# Patient Record
Sex: Male | Born: 1955 | Race: Black or African American | Hispanic: No | Marital: Single
Health system: Southern US, Academic
[De-identification: ages and names within clinical notes are randomized; demographics above are authoritative.]

## PROBLEM LIST (undated history)

## (undated) ENCOUNTER — Ambulatory Visit

## (undated) ENCOUNTER — Telehealth

## (undated) ENCOUNTER — Encounter

## (undated) ENCOUNTER — Encounter: Attending: Ambulatory Care | Primary: Ambulatory Care

## (undated) ENCOUNTER — Ambulatory Visit: Payer: MEDICARE

## (undated) ENCOUNTER — Ambulatory Visit: Attending: Ambulatory Care | Primary: Ambulatory Care

## (undated) ENCOUNTER — Telehealth: Attending: Ambulatory Care | Primary: Ambulatory Care

## (undated) DIAGNOSIS — F4310 Post-traumatic stress disorder, unspecified: Secondary | ICD-10-CM

## (undated) DIAGNOSIS — I10 Essential (primary) hypertension: Secondary | ICD-10-CM

## (undated) DIAGNOSIS — F909 Attention-deficit hyperactivity disorder, unspecified type: Secondary | ICD-10-CM

## (undated) DIAGNOSIS — F209 Schizophrenia, unspecified: Secondary | ICD-10-CM

## (undated) DIAGNOSIS — F32A Depression, unspecified: Secondary | ICD-10-CM

## (undated) DIAGNOSIS — F329 Major depressive disorder, single episode, unspecified: Secondary | ICD-10-CM

## (undated) DIAGNOSIS — F419 Anxiety disorder, unspecified: Secondary | ICD-10-CM

## (undated) HISTORY — PX: NASAL DERMOID CYST EXCISION: SHX2063

---
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
## 2015-02-15 ENCOUNTER — Emergency Department
Admission: EM | Admit: 2015-02-15 | Discharge: 2015-02-15 | Discharge status: Absent without leave | Payer: Medicare Other

## 2015-02-15 NOTE — ED Notes (Signed)
Patient should be dismissed from board. Dismiss icon not available.

## 2015-03-26 DIAGNOSIS — E559 Vitamin D deficiency, unspecified: Secondary | ICD-10-CM | POA: Insufficient documentation

## 2015-03-26 DIAGNOSIS — I10 Essential (primary) hypertension: Secondary | ICD-10-CM | POA: Insufficient documentation

## 2015-04-06 DIAGNOSIS — Z227 Latent tuberculosis: Secondary | ICD-10-CM | POA: Insufficient documentation

## 2015-04-06 DIAGNOSIS — Z79899 Other long term (current) drug therapy: Secondary | ICD-10-CM | POA: Insufficient documentation

## 2015-04-06 DIAGNOSIS — M059 Rheumatoid arthritis with rheumatoid factor, unspecified: Secondary | ICD-10-CM | POA: Insufficient documentation

## 2015-04-06 DIAGNOSIS — I7300 Raynaud's syndrome without gangrene: Secondary | ICD-10-CM | POA: Insufficient documentation

## 2015-04-29 ENCOUNTER — Encounter: Payer: Self-pay | Admitting: *Deleted

## 2015-04-30 ENCOUNTER — Encounter: Admission: RE | Payer: Self-pay | Source: Ambulatory Visit

## 2015-04-30 ENCOUNTER — Ambulatory Visit: Admission: RE | Admit: 2015-04-30 | Payer: Medicare Other | Source: Ambulatory Visit | Admitting: Gastroenterology

## 2015-04-30 HISTORY — DX: Essential (primary) hypertension: I10

## 2015-04-30 HISTORY — DX: Post-traumatic stress disorder, unspecified: F43.10

## 2015-04-30 HISTORY — DX: Depression, unspecified: F32.A

## 2015-04-30 HISTORY — DX: Schizophrenia, unspecified: F20.9

## 2015-04-30 HISTORY — DX: Anxiety disorder, unspecified: F41.9

## 2015-04-30 HISTORY — DX: Attention-deficit hyperactivity disorder, unspecified type: F90.9

## 2015-04-30 HISTORY — DX: Major depressive disorder, single episode, unspecified: F32.9

## 2015-04-30 SURGERY — COLONOSCOPY WITH PROPOFOL
Anesthesia: General

## 2015-06-28 DIAGNOSIS — F411 Generalized anxiety disorder: Secondary | ICD-10-CM | POA: Insufficient documentation

## 2016-05-07 ENCOUNTER — Encounter: Payer: Self-pay | Admitting: Emergency Medicine

## 2016-05-07 ENCOUNTER — Emergency Department: Payer: Medicare Other

## 2016-05-07 ENCOUNTER — Emergency Department
Admission: EM | Admit: 2016-05-07 | Discharge: 2016-05-07 | Disposition: A | Discharge status: Home / Self Care | Payer: Medicare Other | Attending: Emergency Medicine | Admitting: Emergency Medicine

## 2016-05-07 DIAGNOSIS — I10 Essential (primary) hypertension: Secondary | ICD-10-CM | POA: Diagnosis not present

## 2016-05-07 DIAGNOSIS — R040 Epistaxis: Secondary | ICD-10-CM | POA: Diagnosis not present

## 2016-05-07 DIAGNOSIS — Z5321 Procedure and treatment not carried out due to patient leaving prior to being seen by health care provider: Secondary | ICD-10-CM | POA: Diagnosis not present

## 2016-05-07 DIAGNOSIS — Z79899 Other long term (current) drug therapy: Secondary | ICD-10-CM | POA: Insufficient documentation

## 2016-05-07 LAB — CBC
HEMATOCRIT: 39.8 % — AB (ref 40.0–52.0)
HEMOGLOBIN: 13.5 g/dL (ref 13.0–18.0)
MCH: 30.6 pg (ref 26.0–34.0)
MCHC: 34 g/dL (ref 32.0–36.0)
MCV: 89.9 fL (ref 80.0–100.0)
Platelets: 224 10*3/uL (ref 150–440)
RBC: 4.42 MIL/uL (ref 4.40–5.90)
RDW: 14.3 % (ref 11.5–14.5)
WBC: 5.7 10*3/uL (ref 3.8–10.6)

## 2016-05-07 LAB — BASIC METABOLIC PANEL
ANION GAP: 3 — AB (ref 5–15)
BUN: 22 mg/dL — ABNORMAL HIGH (ref 6–20)
CALCIUM: 9.3 mg/dL (ref 8.9–10.3)
CO2: 29 mmol/L (ref 22–32)
CREATININE: 1.21 mg/dL (ref 0.61–1.24)
Chloride: 108 mmol/L (ref 101–111)
GFR calc non Af Amer: >60 mL/min (ref >60)
Glucose, Bld: 94 mg/dL (ref 65–99)
Potassium: 4.5 mmol/L (ref 3.5–5.1)
SODIUM: 140 mmol/L (ref 135–145)

## 2016-05-07 LAB — TROPONIN I: Troponin I: <0.03 ng/mL (ref <0.03)

## 2016-05-07 NOTE — ED Triage Notes (Addendum)
Pt presents to ED with c/o epistaxis x 3 weeks, intermittently. Pt presents now with bleeding to his L nostril. Pt also c/o chest pain that started this morning, R sided, sharp in nature. Pt denies radiation. Pt states he thinks the chest pain, is related to his anxiety, states he normally takes medication for anxiety but he does not have any. Pt has tissues applied to L nostril in triage. Pt states he is getting infusions of [employer] for arthritis q 2 months.

## 2016-05-07 NOTE — ED Notes (Signed)
Pt came to desk and stated he was going to leave. Pt stated he was not going to wait any longer. No apparent distress noted. No bleeding from nose noted.

## 2016-05-08 ENCOUNTER — Telehealth: Payer: Self-pay | Admitting: Emergency Medicine

## 2016-05-08 NOTE — Telephone Encounter (Signed)
Called patient due to lwot to inquire about condition and follow up plans. The number listed has voicemail message with somone elses name on it.  Did not leave message

## 2016-09-01 DIAGNOSIS — F209 Schizophrenia, unspecified: Secondary | ICD-10-CM | POA: Insufficient documentation

## 2016-09-01 DIAGNOSIS — D649 Anemia, unspecified: Secondary | ICD-10-CM | POA: Insufficient documentation

## 2016-09-01 DIAGNOSIS — Z8619 Personal history of other infectious and parasitic diseases: Secondary | ICD-10-CM | POA: Insufficient documentation

## 2016-09-01 DIAGNOSIS — R21 Rash and other nonspecific skin eruption: Secondary | ICD-10-CM | POA: Insufficient documentation

## 2016-09-06 ENCOUNTER — Ambulatory Visit
Admission: RE | Admit: 2016-09-06 | Discharge: 2016-09-06 | Disposition: A | Discharge status: Home / Self Care | Payer: MEDICARE

## 2016-09-06 DIAGNOSIS — M0579 Rheumatoid arthritis with rheumatoid factor of multiple sites without organ or systems involvement: Principal | ICD-10-CM

## 2016-11-01 ENCOUNTER — Ambulatory Visit
Admission: RE | Admit: 2016-11-01 | Discharge: 2016-11-01 | Disposition: A | Discharge status: Home / Self Care | Payer: MEDICARE

## 2016-11-01 DIAGNOSIS — M0579 Rheumatoid arthritis with rheumatoid factor of multiple sites without organ or systems involvement: Principal | ICD-10-CM

## 2016-12-27 ENCOUNTER — Ambulatory Visit
Admission: RE | Admit: 2016-12-27 | Discharge: 2016-12-27 | Disposition: A | Discharge status: Home / Self Care | Payer: MEDICARE

## 2016-12-27 DIAGNOSIS — M0579 Rheumatoid arthritis with rheumatoid factor of multiple sites without organ or systems involvement: Principal | ICD-10-CM

## 2017-01-19 ENCOUNTER — Ambulatory Visit
Admission: RE | Admit: 2017-01-19 | Discharge: 2017-01-19 | Disposition: A | Discharge status: Home / Self Care | Payer: MEDICARE | Attending: Rheumatology

## 2017-01-19 DIAGNOSIS — Z79899 Other long term (current) drug therapy: Secondary | ICD-10-CM

## 2017-01-19 DIAGNOSIS — M0579 Rheumatoid arthritis with rheumatoid factor of multiple sites without organ or systems involvement: Principal | ICD-10-CM

## 2017-01-19 DIAGNOSIS — Z7189 Other specified counseling: Secondary | ICD-10-CM

## 2017-01-19 MED ORDER — METHOTREXATE SODIUM 25 MG/ML INJECTION SOLUTION
SUBCUTANEOUS | 1 refills | 0 days | Status: CP
Start: 2017-01-19 — End: 2017-04-16

## 2017-01-19 MED ORDER — FOLIC ACID 1 MG TABLET
ORAL_TABLET | Freq: Every day | ORAL | 3 refills | 0.00000 days | Status: CP
Start: 2017-01-19 — End: 2017-04-16

## 2017-01-29 NOTE — Unmapped (Signed)
Reached out to patient at the request of Dr. Sullivan Lone to discuss cost of medications.  Subq methotrexate script was sent to [facility] but patient will need to apply to [facility] PAP program.  Patient informed Dr. Sullivan Lone cannot afford folic acid also.  Left VM with patient again requesting call back.     [name]

## 2017-02-01 NOTE — Unmapped (Addendum)
Patient returned phone call and states he cannot afford any of his medications.  He is interested in applying for the [facility] PAP program.  He has Medicare Part A and B and thus would only be approved for PAP until the end of the year and cannot be re-applied.  Application completed with patient over the phone and advised him to bring documentation of income and and ID to clinic on 02/22/16 during his Remicade infusion appointment.  We will not submit PAP application until January in order to qualify patient for 1 year of PAP.     Patient expressed understanding.    [name]

## 2017-03-06 NOTE — Unmapped (Signed)
Patient no show to Remicade infusion, therefore unable to obtain patient's signature and documentation of income for PAP application.  Incomplete application available in my office once patient decides to proceed.     Roy Kirk

## 2017-03-07 NOTE — Unmapped (Signed)
Per Dr. Elisabeth Cara 1/16 msgCan you call this pt and ask him if he is interested in continuing to follow at [facility] to schedule a f/u appt with me?????April or so would be great.

## 2017-03-13 ENCOUNTER — Emergency Department
Admission: EM | Admit: 2017-03-13 | Discharge: 2017-03-13 | Disposition: A | Discharge status: Home / Self Care | Payer: Medicare Other | Attending: Emergency Medicine | Admitting: Emergency Medicine

## 2017-03-13 ENCOUNTER — Encounter: Payer: Self-pay | Admitting: Emergency Medicine

## 2017-03-13 ENCOUNTER — Emergency Department: Payer: Medicare Other

## 2017-03-13 DIAGNOSIS — L249 Irritant contact dermatitis, unspecified cause: Secondary | ICD-10-CM | POA: Diagnosis not present

## 2017-03-13 DIAGNOSIS — Z79899 Other long term (current) drug therapy: Secondary | ICD-10-CM | POA: Diagnosis not present

## 2017-03-13 DIAGNOSIS — I10 Essential (primary) hypertension: Secondary | ICD-10-CM | POA: Diagnosis not present

## 2017-03-13 DIAGNOSIS — R05 Cough: Secondary | ICD-10-CM | POA: Insufficient documentation

## 2017-03-13 DIAGNOSIS — R0789 Other chest pain: Secondary | ICD-10-CM

## 2017-03-13 LAB — BASIC METABOLIC PANEL
Anion gap: 9 (ref 5–15)
BUN: 20 mg/dL (ref 6–20)
CO2: 24 mmol/L (ref 22–32)
Calcium: 9.4 mg/dL (ref 8.9–10.3)
Chloride: 105 mmol/L (ref 101–111)
Creatinine, Ser: 1.23 mg/dL (ref 0.61–1.24)
GFR calc Af Amer: >60 mL/min (ref >60)
GLUCOSE: 105 mg/dL — AB (ref 65–99)
Potassium: 4 mmol/L (ref 3.5–5.1)
SODIUM: 138 mmol/L (ref 135–145)

## 2017-03-13 LAB — TROPONIN I

## 2017-03-13 LAB — CBC
HEMATOCRIT: 39.7 % — AB (ref 40.0–52.0)
Hemoglobin: 13.3 g/dL (ref 13.0–18.0)
MCH: 30.7 pg (ref 26.0–34.0)
MCHC: 33.5 g/dL (ref 32.0–36.0)
MCV: 91.6 fL (ref 80.0–100.0)
PLATELETS: 247 10*3/uL (ref 150–440)
RBC: 4.34 MIL/uL — ABNORMAL LOW (ref 4.40–5.90)
RDW: 14.4 % (ref 11.5–14.5)
WBC: 7.1 10*3/uL (ref 3.8–10.6)

## 2017-03-13 MED ORDER — NAPROXEN 375 MG PO TABS: 375.0000 mg | ORAL_TABLET | Freq: Two times a day (BID) | ORAL | 0 refills | Status: AC

## 2017-03-13 MED ORDER — TRIAMCINOLONE ACETONIDE 0.5 % EX OINT: 1.0000 "application " | TOPICAL_OINTMENT | Freq: Two times a day (BID) | CUTANEOUS | 0 refills | Status: DC

## 2017-03-13 NOTE — ED Provider Notes (Signed)
[facility] Emergency Department Provider Note  ____________________________________________   I have reviewed the triage vital signs and the nursing notes. Where available I have reviewed prior notes and, if possible and indicated, outside hospital notes.    HISTORY  Chief Complaint No chief complaint on file.    HPI Austin Preston is a 62 y.o. male this with 2 things, the first is that he is "getting over a cold" has had some chest wall pain when he coughs on the left side very reproducible worse when he touches it nothing makes it worse aside from coughing or change in position or touching it, there is no exertional component, does not radiate, so sharp tenderness to the chest wall, he is certain it is nothing else but he wanted to have it looked at, he denies any rash in that area, denies any fever chills.  States his breathing is normal and that he is over his cold.  He has not tried anything for this pain it is sharp.  Patient complains of a itchy rash around his forehead and the back of his scalp where he used a hair dye which has caused him to be itchy.  He is taking Benadryl with minimal success.  No anaphylactic symptoms no shortness of breath no facial swelling no tongue swelling etc.     Past Medical History:  Diagnosis Date  . ADHD (attention deficit hyperactivity disorder)   . Anxiety   . Depression   . Hypertension   . PTSD (post-traumatic stress disorder)   . Schizophrenia (HCC)     There are no active problems to display for this patient.   Past Surgical History:  Procedure Laterality Date  . NASAL DERMOID CYST EXCISION      Prior to Admission medications   Medication Sig Start Date End Date Taking? Authorizing Provider  amitriptyline (ELAVIL) 10 MG tablet Take 10 mg by mouth at bedtime.    [provider]  diazepam (VALIUM) 10 MG tablet Take 10 mg by mouth 2 (two) times daily as needed for anxiety.    [provider]   ergocalciferol (VITAMIN D2) 50000 units capsule Take 50,000 Units by mouth once a week.    [provider]  folic acid (FOLVITE) 1 MG tablet Take 1 mg by mouth daily.    [provider]  inFLIXimab (REMICADE) 100 MG injection Inject 100 mg into the vein.    [provider]  lisinopril-hydrochlorothiazide (PRINZIDE,ZESTORETIC) 20-25 MG tablet Take 1 tablet by mouth daily.    [provider]  methotrexate (RHEUMATREX) 2.5 MG tablet Take 20 mg by mouth once a week.    [provider]    Allergies Patient has no known allergies.  No family history on file.  Social History Social History   Tobacco Use  . Smoking status: Never Smoker  . Smokeless tobacco: Never Used  Substance Use Topics  . Alcohol use: Yes  . Drug use: No    Comment: NO currently drug use, hx of use of "everything".    Review of Systems Constitutional: No fever/chills Eyes: No visual changes. ENT: No sore throat. No stiff neck no neck pain Cardiovascular: + chest pain. Respiratory: Denies shortness of breath. Gastrointestinal:   no vomiting.  No diarrhea.  No constipation. Genitourinary: Negative for dysuria. Musculoskeletal: Negative lower extremity swelling Skin: + for rash. Neurological: Negative for severe headaches, focal weakness or numbness.   ____________________________________________   PHYSICAL EXAM:  VITAL SIGNS: ED Triage Vitals  Enc Vitals Group     BP 03/13/17 1608 (!) 166/96     Pulse Rate 03/13/17 1608 75     Resp 03/13/17 1608 20     Temp 03/13/17 1608 99 F (37.2 C)     Temp Source 03/13/17 1608 Oral     SpO2 03/13/17 1608 98 %     Weight 03/13/17 1610 175 lb (79.4 kg)     Height 03/13/17 1610 5\' 9"  (1.753 m)     Head Circumference --      Peak Flow --      Pain Score 03/13/17 1615 9     Pain Loc --      Pain Edu? --      Excl. in GC? --     Constitutional: Alert and oriented. Well appearing and in no acute distress. Eyes:  Conjunctivae are normal Head: Atraumatic HEENT: No congestion/rhinnorhea. Mucous membranes are moist.  Oropharynx non-erythematous Neck:   Nontender with no meningismus, no masses, no stridor Cardiovascular: Normal rate, regular rhythm. Grossly normal heart sounds.  Good peripheral circulation. Respiratory: Normal respiratory effort.  No retractions. Lungs CTAB. Abdominal: Soft and nontender. No distention. No guarding no rebound Chest: Tender to palpation left chest wall necessary patient states "of chest pain with or or" and feels back.  No crepitus no flail chest, no lesions noted.  No rib fracture detected on exam Back:  There is no focal tenderness or step off.  there is no midline tenderness there are no lesions noted. there is no CVA tenderness  Musculoskeletal: No lower extremity tenderness, no upper extremity tenderness. No joint effusions, no DVT signs strong distal pulses no edema Neurologic:  Normal speech and language. No gross focal neurologic deficits are appreciated.  Skin:  Skin is warm, dry and intact.  Erythematous rash around the patient's hairline with some mild excoriation no evidence of cellulitis or abscess, involves both the front and the back of the her . Psychiatric: Mood and affect are normal. Speech and behavior are normal.  ____________________________________________   LABS (all labs ordered are listed, but only abnormal results are displayed)  Labs Reviewed  BASIC METABOLIC PANEL - Abnormal; Notable for the following components:      Result Value   Glucose, Bld 105 (*)    All other components within normal limits  CBC - Abnormal; Notable for the following components:   RBC 4.34 (*)    HCT 39.7 (*)    All other components within normal limits  TROPONIN I    Pertinent labs  results that were available during my care of the patient were reviewed by me and considered in my medical decision making (see chart for  details). ____________________________________________  EKG  I personally interpreted any EKGs ordered by me or triage Sinus rhythm rate 72 bpm no acute ST elevation or depression, borderline LAD, no acute ischemic changes ____________________________________________  RADIOLOGY  Pertinent labs & imaging results that were available during my care of the patient were reviewed by me and considered in my medical decision making (see chart for details). If possible, patient and/or family made aware of any abnormal findings.  Dg Chest 2 View  Result Date: 03/13/2017 CLINICAL DATA:  Chest pain for the past 3 days. EXAM: CHEST  2 VIEW COMPARISON:  Chest x-ray dated May 07, 2016. FINDINGS: The heart size and mediastinal contours are within normal limits. Unchanged calcified hilar lymph nodes and granulomas in both lungs. No focal consolidation, pleural effusion, or pneumothorax. The visualized  skeletal structures are unremarkable. IMPRESSION: No active cardiopulmonary disease. Electronically Signed   By: Obie DredgeWilliam T Derry M.D.   On: 03/13/2017 16:38   ____________________________________________    PROCEDURES  Procedure(s) performed: None  Procedures  Critical Care performed: None  ____________________________________________   INITIAL IMPRESSION / ASSESSMENT AND PLAN / ED COURSE  Pertinent labs & imaging results that were available during my care of the patient were reviewed by me and considered in my medical decision making (see chart for details).  With very reproducible chest wall pain after a cough, at this time, there does not appear to be clinical evidence to support the diagnosis of pulmonary embolus, dissection, myocarditis, endocarditis, pericarditis, pericardial tamponade, acute coronary syndrome, pneumothorax, pneumonia, or any other acute intrathoracic pathology that will require admission or acute intervention. Nor is there evidence of any significant intra-abdominal pathology  causing this discomfort.  In addition he has what appears to be contact dermatitis which we will treat with topical steroids..         ____________________________________________   FINAL CLINICAL IMPRESSION(S) / ED DIAGNOSES  Final diagnoses:  None      This chart was dictated using voice recognition software.  Despite best efforts to proofread,  errors can occur which can change meaning.      Jeanmarie PlantMcShane, Hondo Nanda A, MD 03/13/17 [phone]

## 2017-03-13 NOTE — ED Triage Notes (Signed)
Pt in via POV with complaints of left side chest pain x three days.  Pt also with complaints of rash to face since using hair dye x one week ago, complaints of severe itching, taking benadryl without any relief.  Pt unsure if the two are related.  Vitals WDL, NAD noted at this time.

## 2017-03-13 NOTE — ED Notes (Signed)
Pt discharged to home.  Family member driving.  Discharge instructions reviewed.  Verbalized understanding.  No questions or concerns at this time.  Teach back verified.  Pt in NAD.  No items left in ED.   

## 2017-03-27 DIAGNOSIS — L403 Pustulosis palmaris et plantaris: Secondary | ICD-10-CM | POA: Insufficient documentation

## 2017-04-09 ENCOUNTER — Encounter: Admit: 2017-04-09 | Discharge: 2017-04-10 | Payer: MEDICARE

## 2017-04-09 DIAGNOSIS — M0579 Rheumatoid arthritis with rheumatoid factor of multiple sites without organ or systems involvement: Principal | ICD-10-CM

## 2017-04-09 LAB — CREATININE
Creatinine:MCnc:Pt:Ser/Plas:Qn:: 1.1
EGFR MDRD NON AF AMER: 69 mL/min/{1.73_m2} (ref >=60)

## 2017-04-09 LAB — CBC W/ AUTO DIFF
BASOPHILS ABSOLUTE COUNT: 0 10*9/L (ref 0.0–0.1)
BASOPHILS RELATIVE PERCENT: 0.6 %
EOSINOPHILS ABSOLUTE COUNT: 0.2 10*9/L (ref 0.0–0.4)
EOSINOPHILS RELATIVE PERCENT: 3.1 %
HEMATOCRIT: 42 % (ref 41.0–53.0)
HEMOGLOBIN: 13.7 g/dL (ref 13.5–17.5)
LARGE UNSTAINED CELLS: 3 % (ref 0–4)
LYMPHOCYTES ABSOLUTE COUNT: 1.5 10*9/L (ref 1.5–5.0)
LYMPHOCYTES RELATIVE PERCENT: 25.3 %
MEAN CORPUSCULAR HEMOGLOBIN CONC: 32.6 g/dL (ref 31.0–37.0)
MEAN CORPUSCULAR HEMOGLOBIN: 29.7 pg (ref 26.0–34.0)
MEAN CORPUSCULAR VOLUME: 90.9 fL (ref 80.0–100.0)
MEAN PLATELET VOLUME: 7.6 fL (ref 7.0–10.0)
MONOCYTES RELATIVE PERCENT: 11.1 %
NEUTROPHILS ABSOLUTE COUNT: 3.4 10*9/L (ref 2.0–7.5)
NEUTROPHILS RELATIVE PERCENT: 57.1 %
RED BLOOD CELL COUNT: 4.62 10*12/L (ref 4.50–5.90)
WBC ADJUSTED: 5.9 10*9/L (ref 4.5–11.0)

## 2017-04-09 LAB — ALT (SGPT): Alanine aminotransferase:CCnc:Pt:Ser/Plas:Qn:: 26

## 2017-04-09 LAB — BLOOD UREA NITROGEN: Urea nitrogen:MCnc:Pt:Ser/Plas:Qn:: 13

## 2017-04-09 LAB — AST (SGOT): Aspartate aminotransferase:CCnc:Pt:Ser/Plas:Qn:: 32

## 2017-04-09 LAB — EOSINOPHILS RELATIVE PERCENT: Lab: 3.1

## 2017-04-09 NOTE — Unmapped (Signed)
Patient in today for Remicade infusion. Patient has no s/s of infection. No issues from the last infusion.  Remicade started ,patient instructed to use call bell /call nurse for any s/s of unusual symptoms during infusion. Patient educated on possible s/s of reaction,such as chest pain ,itching,shortness of breath lightheadedness and any kind of discomfort. Patient verbalized understanding.  PIV #24G initiated to right AC.  Labs drawn.  Pt refused all pre-meds.  Remicade 500mg /250 NS started at 1111 and infused over 2hr protocol. Infusion completed and well tolerated.  Pt alert and oriented with no complaints during infusion. Pt tolerated infusion well.  Remicade 2hr protocol  Start of infusion 10ml x                             20ml x                            40ml x                            80ml x                          x                          x  PIV discontinued. Coban and gauze applied.

## 2017-04-09 NOTE — Unmapped (Signed)
Patient is receiving Remicade infusion today.  He brought in documentation if income and provided signatures for [facility] PAP application.  Application faxed to program and advised patient to call them directly for status update.      Roy Kirk

## 2017-04-09 NOTE — Unmapped (Signed)
Requested patient stay for a time after last b/p- to monitor- patient refused

## 2017-04-16 MED ORDER — METHOTREXATE SODIUM 25 MG/ML INJECTION SOLUTION
SUBCUTANEOUS | 1 refills | 0.00000 days | Status: CP
Start: 2017-04-16 — End: 2017-12-24

## 2017-04-16 MED ORDER — SYRINGE WITH NEEDLE 1 ML 27 X 1/2"
INJECTION | SUBCUTANEOUS | 8 refills | 0.00000 days | Status: CP
Start: 2017-04-16 — End: 2017-04-16

## 2017-04-16 MED ORDER — SYRINGE WITH NEEDLE 1 ML 27 X 1/2": each | 8 refills | 0 days | Status: AC

## 2017-04-16 MED ORDER — FOLIC ACID 1 MG TABLET: 1 mg | tablet | Freq: Every day | 1 refills | 0 days | Status: AC

## 2017-04-16 MED ORDER — METHOTREXATE SODIUM 25 MG/ML INJECTION SOLUTION: mL | 1 refills | 0 days

## 2017-04-16 MED ORDER — FOLIC ACID 1 MG TABLET
ORAL_TABLET | Freq: Every day | ORAL | 1 refills | 0.00000 days | Status: CP
Start: 2017-04-16 — End: 2017-12-24

## 2017-04-16 MED FILL — METHOTREXATE/25MG/ML/SOLN: METHOTREXATE/25MG/ML/SOLN | 84 days supply | Qty: 6 | Fill #0

## 2017-04-16 MED FILL — FOLIC ACID/1MG/TABS: FOLIC ACID/1MG/TABS | 90 days supply | Qty: 90 | Fill #0

## 2017-04-16 NOTE — Unmapped (Signed)
[facility] - Pharmacist Onboarding Note    Specialty Medication: subq methotrexate     Roy Kirk, DOB: October 29, 1955  Above HIPAA information was verified with patient.     Roy Kirk is a 62 y.o. male being initiated on subq methotrexate for rheumatoid arthritis. Medication list, allergies and comorbidities reviewed:  appropriate to continue therapy.       Regimen & Administration: Methotrexate 25 mg (1 mL) subq once every 7 days.    Administer without regards to meal.  If a dose is missed, administer as soon as it is remembered and restart administration cycle    Patient has been on subq methotrexate before and reports adherence to therapy.  He has a nursing aid that helps with subq methotrexate weekly.  He denies further injection training and medication counseling at this time.      Drug-Drug & Drug-Food Interactions:  None noted    Pharmacy Information:    ?? Patient will be receiving medication from the [facility] [phone], option 4).  A representative from the pharmacy will contact the patient to set up deliveries 7-10 days prior to their subsequent needed refill.  The pharmacy must speak to the patient to schedule the refill.  Advised patient to answer phone calls from the pharmacy to prevent delays in therapy.    ?? Patient will receive a medication information handout as well as a welcome packet from the pharmacy.    ?? The pharmacy is open Monday - Friday 8:30am-4:30pm.  A pharmacist is available 24/7 via pager to answer any clinical questions.    ?? Patient will receive medication through common mail carrier (UPS).  ?? Anticipated co-pay:  $0 for a 3 month supply through [facility] PAP program.      Emphasized the importance of adherence to prescribed regimen, clinic follow-up visits, and laboratory testing.      SHIPPING     ?? Shipping address verified in FSI.  Expected medication delivery date: 04/18/17.  Patient plans to start therapy on the same day.    ?? Other medications/items to be shipping:  Folic acid, syringe with needles, sharps container    Patient specific needs were assessed and addressed:  language differences, literacy level, cultural barriers, cognitive and/or physical impairments.      All questions were answered and contact information provided for any future questions/concerns.      Jeneen Montgomery

## 2017-04-16 NOTE — Unmapped (Signed)
[facility] Specialty Medication Referral: No PA required    Medication (Brand/Generic): METHOTREXATE    Initial FSI Test Claim completed with resulted information below:  No PA required  Patient ABLE to fill at [facility]  Insurance Company:  PAP  Anticipated Copay: $0    As Co-pay is under $100 defined limit, per policy there will be no further investigation of need for financial assistance at this time unless patient requests. This referral has been communicated to the provider and handed off to the [facility] team for further processing and filling of prescribed medication.   ______________________________________________________________________  Please utilize this referral for viewing purposes as it will serve as the central location for all relevant documentation and updates.

## 2017-04-17 MED ORDER — EMPTY CONTAINER
3 refills | 0 days
Start: 2017-04-17 — End: 2018-02-21

## 2017-04-17 MED FILL — SHARPS KIT/NA/MISC: SHARPS KIT/NA/MISC | 100 days supply | Qty: 1 | Fill #0

## 2017-04-17 MED FILL — BD TB 1mL/27G 1/2 INCH/SYR: BD TB 1mL/27G 1/2 INCH/SYR | 84 days supply | Qty: 12 | Fill #0

## 2017-06-06 ENCOUNTER — Encounter: Admit: 2017-06-06 | Discharge: 2017-06-07 | Payer: MEDICARE

## 2017-06-06 DIAGNOSIS — M0579 Rheumatoid arthritis with rheumatoid factor of multiple sites without organ or systems involvement: Principal | ICD-10-CM

## 2017-06-06 LAB — CBC W/ AUTO DIFF
BASOPHILS ABSOLUTE COUNT: 0 10*9/L (ref 0.0–0.1)
BASOPHILS RELATIVE PERCENT: 0.5 %
HEMATOCRIT: 42.4 % (ref 41.0–53.0)
HEMOGLOBIN: 13.6 g/dL (ref 13.5–17.5)
LARGE UNSTAINED CELLS: 3 % (ref 0–4)
LYMPHOCYTES ABSOLUTE COUNT: 1.9 10*9/L (ref 1.5–5.0)
LYMPHOCYTES RELATIVE PERCENT: 27.2 %
MEAN CORPUSCULAR HEMOGLOBIN CONC: 32 g/dL (ref 31.0–37.0)
MEAN CORPUSCULAR HEMOGLOBIN: 29.6 pg (ref 26.0–34.0)
MEAN PLATELET VOLUME: 7.9 fL (ref 7.0–10.0)
MONOCYTES ABSOLUTE COUNT: 0.5 10*9/L (ref 0.2–0.8)
MONOCYTES RELATIVE PERCENT: 6.9 %
NEUTROPHILS ABSOLUTE COUNT: 4.1 10*9/L (ref 2.0–7.5)
NEUTROPHILS RELATIVE PERCENT: 60.1 %
PLATELET COUNT: 250 10*9/L (ref 150–440)
RED BLOOD CELL COUNT: 4.59 10*12/L (ref 4.50–5.90)
RED CELL DISTRIBUTION WIDTH: 14 % (ref 12.0–15.0)
WBC ADJUSTED: 6.8 10*9/L (ref 4.5–11.0)

## 2017-06-06 LAB — BUN: BLOOD UREA NITROGEN: 18 mg/dL (ref 7–21)

## 2017-06-06 LAB — CREATININE
Creatinine:MCnc:Pt:Ser/Plas:Qn:: 1.19
EGFR MDRD NON AF AMER: 63 mL/min/{1.73_m2} (ref >=60)

## 2017-06-06 LAB — MEAN PLATELET VOLUME: Lab: 7.9

## 2017-06-06 LAB — ALT (SGPT): Alanine aminotransferase:CCnc:Pt:Ser/Plas:Qn:: 9 — ABNORMAL LOW

## 2017-06-06 LAB — AST (SGOT): Aspartate aminotransferase:CCnc:Pt:Ser/Plas:Qn:: 21

## 2017-06-06 LAB — BLOOD UREA NITROGEN: Urea nitrogen:MCnc:Pt:Ser/Plas:Qn:: 18

## 2017-06-06 NOTE — Unmapped (Signed)
Pt presents for Remicade infusion.  Pt denies any recent infection, VSS.  IV placed, labs drawn, premeds administered.  Pt aware of potential reaction/side effects, call bell within reach.  1225 Remicade 500mg  started, to infuse at the following rates:  37ml/hr for 15 min  62ml/hr for 15 min  80ml/hr for 15 min  43ml/hr for 15 min  171ml/hr for 30 min  227ml/hr until complete    1430 Infusion complete.  Pt tolerated without complication, VSS.  IV flushed per policy and d/c'd, gauze and coban applied.  Pt left clinic in no acute distress.

## 2017-07-09 NOTE — Unmapped (Signed)
[facility] Specialty Pharmacy Refill Coordination Note    Specialty Medication(s) to be Shipped:   Inflammatory Disorders: methotrexate vials    Other medication(s) to be shipped: folic acid, syringes, sharps container.     Prescilla Kirk, DOB: 1955-07-15  Phone: [phone] (home)   Shipping Address: [address]  [address]  [place] [address]    All above HIPAA information was verified with patient.     Completed refill call assessment today to schedule patient's medication shipment from the [facility] [phone]).       Specialty medication(s) and dose(s) confirmed: Regimen is correct and unchanged.   Changes to medications: Roy Kirk reports no changes reported at this time.  Changes to insurance: No  Questions for the pharmacist: No    The patient will receive an FSI print out for each medication shipped and additional FDA Medication Guides as required.  Patient education from Roy Kirk or Roy Kirk may also be included in the shipment.    DISEASE-SPECIFIC INFORMATION        For Rheumatology patients: Next dose of methotrexate  from this shipment due on 07/18/17 doses every wednesday    ADHERENCE     Medication Adherence    Specialty Medication:  METHOTREXATE VIALS  Patient is on additional specialty medications:  No  Patient is on more than two specialty medications:  No  Any gaps in refill history greater than 2 weeks in the last 3 months:  no  Demonstrates understanding of importance of adherence:  yes  Informant:  patient  Reliability of informant:  reliable  Confirmed plan for next specialty medication refill:  delivery by pharmacy  Refills needed for supportive medications:  not needed          Refill Coordination    Has the Patients' Contact Information Changed:  No  Is the Shipping Address Different:  No         SHIPPING     Shipping address confirmed in FSI.     Delivery Scheduled: Yes, Expected medication delivery date: 07/12/17 thursday via UPS or courier.     Roy Kirk   [facility] Shared [facility] Specialty Technician

## 2017-07-11 MED FILL — BD TB 1mL/27G 1/2 INCH/SYR: BD TB 1mL/27G 1/2 INCH/SYR | 84 days supply | Qty: 12 | Fill #1

## 2017-07-11 MED FILL — METHOTREXATE/25MG/ML/SOLN: METHOTREXATE/25MG/ML/SOLN | 84 days supply | Qty: 6 | Fill #1

## 2017-07-11 MED FILL — SHARPS KIT/NA/MISC: SHARPS KIT/NA/MISC | 100 days supply | Qty: 1 | Fill #1

## 2017-07-11 MED FILL — FOLIC ACID/1MG/TABS: FOLIC ACID/1MG/TABS | 90 days supply | Qty: 90 | Fill #1

## 2017-08-01 ENCOUNTER — Encounter: Admit: 2017-08-01 | Discharge: 2017-08-02 | Payer: MEDICARE

## 2017-08-01 DIAGNOSIS — M0579 Rheumatoid arthritis with rheumatoid factor of multiple sites without organ or systems involvement: Principal | ICD-10-CM

## 2017-08-01 LAB — ALT (SGPT): Alanine aminotransferase:CCnc:Pt:Ser/Plas:Qn:: 15 — ABNORMAL LOW

## 2017-08-01 LAB — CBC W/ AUTO DIFF
BASOPHILS ABSOLUTE COUNT: 0.1 10*9/L (ref 0.0–0.1)
EOSINOPHILS ABSOLUTE COUNT: 0.2 10*9/L (ref 0.0–0.4)
EOSINOPHILS RELATIVE PERCENT: 2.2 %
HEMATOCRIT: 43.7 % (ref 41.0–53.0)
HEMOGLOBIN: 13.8 g/dL (ref 13.5–17.5)
LARGE UNSTAINED CELLS: 3 % (ref 0–4)
LYMPHOCYTES ABSOLUTE COUNT: 2.2 10*9/L (ref 1.5–5.0)
LYMPHOCYTES RELATIVE PERCENT: 32.7 %
MEAN CORPUSCULAR HEMOGLOBIN CONC: 31.6 g/dL (ref 31.0–37.0)
MEAN CORPUSCULAR HEMOGLOBIN: 29.2 pg (ref 26.0–34.0)
MEAN CORPUSCULAR VOLUME: 92.4 fL (ref 80.0–100.0)
MEAN PLATELET VOLUME: 8.2 fL (ref 7.0–10.0)
MONOCYTES ABSOLUTE COUNT: 0.5 10*9/L (ref 0.2–0.8)
MONOCYTES RELATIVE PERCENT: 7.6 %
NEUTROPHILS ABSOLUTE COUNT: 3.6 10*9/L (ref 2.0–7.5)
NEUTROPHILS RELATIVE PERCENT: 53.3 %
PLATELET COUNT: 281 10*9/L (ref 150–440)
RED BLOOD CELL COUNT: 4.73 10*12/L (ref 4.50–5.90)
RED CELL DISTRIBUTION WIDTH: 14.5 % (ref 12.0–15.0)
WBC ADJUSTED: 6.7 10*9/L (ref 4.5–11.0)

## 2017-08-01 LAB — CREATININE: Creatinine:MCnc:Pt:Ser/Plas:Qn:: 1.13

## 2017-08-01 LAB — NEUTROPHILS RELATIVE PERCENT: Lab: 53.3

## 2017-08-01 LAB — BLOOD UREA NITROGEN: Urea nitrogen:MCnc:Pt:Ser/Plas:Qn:: 21

## 2017-08-01 LAB — AST (SGOT): Aspartate aminotransferase:CCnc:Pt:Ser/Plas:Qn:: 24

## 2017-08-01 NOTE — Unmapped (Signed)
Call Bell within reach of patient at chairside.  Patient educated on sign and symptoms of reaction, i.e. itching, rash, shortness of breath instructed to call nurse for any discomfort.   Refuses all pre-meds   Pt present for Remicade Infusion  500  mg in 236ml/of NS  Alert oriented x 3 denies s/sx of infection.  Rate 8ml/hrx15mins  75ml/hrx15mins  3ml/hrx15mins  27ml/hrx15mins  158ml/hrx30mins  259ml/hr to completion  1330 Infusion completed without incident, voices no c/o,   IV  Flushed with 70ml/NS , Dcd and  drsg applied

## 2017-08-01 NOTE — Unmapped (Signed)
Sent letter to patient letting him know that labs were normal.

## 2017-09-26 ENCOUNTER — Encounter: Admit: 2017-09-26 | Discharge: 2017-09-27 | Payer: MEDICARE

## 2017-09-26 DIAGNOSIS — M0579 Rheumatoid arthritis with rheumatoid factor of multiple sites without organ or systems involvement: Principal | ICD-10-CM

## 2017-09-26 LAB — AST (SGOT): Aspartate aminotransferase:CCnc:Pt:Ser/Plas:Qn:: 24

## 2017-09-26 LAB — CREATININE: CREATININE: 1.1 mg/dL (ref 0.70–1.30)

## 2017-09-26 LAB — CBC W/ AUTO DIFF
BASOPHILS ABSOLUTE COUNT: 0 10*9/L (ref 0.0–0.1)
BASOPHILS RELATIVE PERCENT: 0.6 %
EOSINOPHILS RELATIVE PERCENT: 2.2 %
HEMATOCRIT: 41.3 % (ref 41.0–53.0)
HEMOGLOBIN: 13.2 g/dL — ABNORMAL LOW (ref 13.5–17.5)
LARGE UNSTAINED CELLS: 3 % (ref 0–4)
LYMPHOCYTES ABSOLUTE COUNT: 2.2 10*9/L (ref 1.5–5.0)
MEAN CORPUSCULAR HEMOGLOBIN CONC: 31.9 g/dL (ref 31.0–37.0)
MEAN CORPUSCULAR HEMOGLOBIN: 29.3 pg (ref 26.0–34.0)
MEAN CORPUSCULAR VOLUME: 91.6 fL (ref 80.0–100.0)
MEAN PLATELET VOLUME: 7.2 fL (ref 7.0–10.0)
MONOCYTES ABSOLUTE COUNT: 0.4 10*9/L (ref 0.2–0.8)
MONOCYTES RELATIVE PERCENT: 7.2 %
NEUTROPHILS ABSOLUTE COUNT: 3.2 10*9/L (ref 2.0–7.5)
NEUTROPHILS RELATIVE PERCENT: 51.5 %
RED BLOOD CELL COUNT: 4.51 10*12/L (ref 4.50–5.90)
RED CELL DISTRIBUTION WIDTH: 14.6 % (ref 12.0–15.0)
WBC ADJUSTED: 6.1 10*9/L (ref 4.5–11.0)

## 2017-09-26 LAB — ALT (SGPT): Alanine aminotransferase:CCnc:Pt:Ser/Plas:Qn:: <8 — ABNORMAL LOW

## 2017-09-26 LAB — NEUTROPHILS ABSOLUTE COUNT: Lab: 3.2

## 2017-09-26 LAB — BLOOD UREA NITROGEN: Urea nitrogen:MCnc:Pt:Ser/Plas:Qn:: 19

## 2017-09-26 LAB — EGFR CKD-EPI NON-AA MALE: Lab: 73

## 2017-09-26 NOTE — Unmapped (Signed)
Per Dr. Sullivan Lone sched with her or Danne Harbor. vm not set up.

## 2017-09-26 NOTE — Unmapped (Signed)
1125 Patient presents for Remicade infusion.  Presents in stable condition without any recent infections.  Vitals stable.  PIV inserted.       1142 Remicade 600 mg started to infuse as follows:  53ml/hr x 15 min  21ml/hr x 15 min  9ml/hr x 15 min  18ml/hr x 15 min  131ml/hr x 30 min  255ml/hr for the remainder of the infusion.    1342 Remicade infusion complete.  Patient tolerated infusion.  Vitals stable.  PIV flushed with NS.  1345 IV d/c'd.  Patient discharged.

## 2017-09-27 NOTE — Unmapped (Signed)
called both numbers, mailbox full the other no vm set up. pt need to sched f/u with Danne Harbor or Dr. Sullivan Lone.

## 2017-09-29 ENCOUNTER — Other Ambulatory Visit
Admission: EM | Admit: 2017-09-29 | Discharge: 2017-09-29 | Disposition: A | Discharge status: Home / Self Care | Payer: Medicare Other | Attending: Family Medicine | Admitting: Family Medicine

## 2017-09-29 NOTE — ED Notes (Signed)
[place] [facility].  Patient is cooperative with this RN.  Area to right antecub cleansed with betadine, specimens obtained, patient tolerated well.  Specimens given to [employer] [facility] [facility] [place].

## 2017-10-08 NOTE — Unmapped (Signed)
[facility] Refill Coordination Note  Medication: methotrexate vials, folic acid, syringes    Unable to reach patient to schedule shipment for medication being filled at [facility]. Left voicemail on phone.  As this is the 3rd unsuccessful attempt to reach the patient, no additional phone call attempts will be made at this time.      Phone numbers attempted: [phone](left vm), [phone] no vm option with other 2 numbers  Last scheduled delivery: 04/18/17    **patient's delivery from May was returned to us-we attempted to contact the patient to update address, no response from patient**    Please call the [facility] at [phone] (option 4) should you have any further questions.      Thanks,  [facility] Specialty Team

## 2017-10-09 NOTE — Unmapped (Signed)
[facility] Special[facility] Pharmacy Refill and Clinical Assessment Telephone Call   Medication: subq methotrexate    Unable to reach patient to schedule shipment and complete required clinical assessment for subq methotrexate filled through the [facility]. Last methotrexate fill was 04/16/17 for a 2 month supply.  Shipment in April was returned to [facility] via UPS.      Fourth attempt to reach patient (10/09/17) for refills, first attempt for clinical assessment. Left voicemail. Patient may call me back at [phone] to complete clinical assessment.    Jeneen Montgomery

## 2017-10-09 NOTE — Unmapped (Signed)
[facility]: Rheumatology Clinic Assessment and Refill Call    Specialty Medication(s): subq methotrexate  Indication(s): rheumatoid arthritis      [name], DOB: 06/11/1955  Above HIPAA information was verified with patient.      Medications reviewed & verified: Allergies - Medications -      Specialty medication(s) & dose(s) confirmed: yes  Changes to medications: no  Tolerating medications:   Adverse Effects    *All other systems reviewed and are negative        Amount of medications patient has on hand:  0    CLINICAL ASSESSMENT     Idrissa Beville tolerated subq methotrexate well while he was on therapy.  Unfortunately he missed 3 months of subq methotrexate and folic acid.  Per patient, [facility] shipped medication out in May.  UPS attempted to drop med off but patient was not at home, thus meds were returned to [facility].  He contacted the pharmacy and was told his meds will be resent but he never received them.  Advised patient to call me in the future if he runs into similar issues.  He continues his Remicade infusions w/o any issue.  RA well managed, no issues recently.  Patient has a nurse aid that comes out weekly to assist with subq methotrexate injection.  Plan to resume therapy this week.     Does Sullivan have follow up appointment scheduled with clinic? Yes, appointment is scheduled and patient is aware    SHIPPING     ?? Shipping address verified in FSI.  Expected medication delivery date: 10/11/17, via UPS.  Next dose of subq methotrexate from this shipment due on the same day.  ?? Other medications/items to be shipping:  Folic acid, syringe with needles    The patient will receive an FSI print out for each medication shipped and additional FDA Medication Guides as required.  Patient education from [place] or Robet Leu may also be included in the shipment    All questions were answered and contact information provided for any future questions/concerns.      Jeneen Montgomery

## 2017-10-11 MED FILL — METHOTREXATE SODIUM 25 MG/ML INJECTION SOLUTION: 70 days supply | Qty: 10 | Fill #0

## 2017-10-11 MED FILL — METHOTREXATE SODIUM 25 MG/ML INJECTION SOLUTION: 70 days supply | Qty: 10 | Fill #0 | Status: AC

## 2017-10-11 MED FILL — EMPTY CONTAINER: 30 days supply | Qty: 1 | Fill #0 | Status: AC

## 2017-10-11 MED FILL — FOLIC ACID 1 MG TABLET: 90 days supply | Qty: 90 | Fill #0 | Status: AC

## 2017-10-11 MED FILL — BD TUBERCULIN SYRINGE 1 ML 27 X 1/2": 84 days supply | Qty: 12 | Fill #0 | Status: AC

## 2017-10-11 MED FILL — EMPTY CONTAINER: 30 days supply | Qty: 1 | Fill #0

## 2017-10-11 MED FILL — FOLIC ACID 1 MG TABLET: 90 days supply | Qty: 90 | Fill #0

## 2017-10-11 MED FILL — BD TUBERCULIN SYRINGE 1 ML 27 X 1/2": 84 days supply | Qty: 12 | Fill #0

## 2017-11-28 ENCOUNTER — Encounter: Admit: 2017-11-28 | Discharge: 2017-11-29 | Payer: MEDICARE

## 2017-11-28 DIAGNOSIS — M0579 Rheumatoid arthritis with rheumatoid factor of multiple sites without organ or systems involvement: Principal | ICD-10-CM

## 2017-11-28 LAB — CREATININE
CREATININE: 1.16 mg/dL (ref 0.70–1.30)
EGFR CKD-EPI AA MALE: 79 mL/min/{1.73_m2} (ref >=60)

## 2017-11-28 LAB — CBC W/ AUTO DIFF
BASOPHILS ABSOLUTE COUNT: 0.1 10*9/L (ref 0.0–0.1)
BASOPHILS RELATIVE PERCENT: 0.9 %
EOSINOPHILS ABSOLUTE COUNT: 0.2 10*9/L (ref 0.0–0.4)
EOSINOPHILS RELATIVE PERCENT: 2.5 %
HEMATOCRIT: 42.3 % (ref 41.0–53.0)
HEMOGLOBIN: 13.4 g/dL — ABNORMAL LOW (ref 13.5–17.5)
LARGE UNSTAINED CELLS: 3 % (ref 0–4)
MEAN CORPUSCULAR HEMOGLOBIN CONC: 31.6 g/dL (ref 31.0–37.0)
MEAN CORPUSCULAR HEMOGLOBIN: 29.3 pg (ref 26.0–34.0)
MEAN CORPUSCULAR VOLUME: 92.9 fL (ref 80.0–100.0)
MEAN PLATELET VOLUME: 7 fL (ref 7.0–10.0)
MONOCYTES ABSOLUTE COUNT: 0.4 10*9/L (ref 0.2–0.8)
MONOCYTES RELATIVE PERCENT: 6.7 %
NEUTROPHILS ABSOLUTE COUNT: 3.4 10*9/L (ref 2.0–7.5)
NEUTROPHILS RELATIVE PERCENT: 53.1 %
PLATELET COUNT: 228 10*9/L (ref 150–440)
RED BLOOD CELL COUNT: 4.56 10*12/L (ref 4.50–5.90)
WBC ADJUSTED: 6.3 10*9/L (ref 4.5–11.0)

## 2017-11-28 LAB — BLOOD UREA NITROGEN: Urea nitrogen:MCnc:Pt:Ser/Plas:Qn:: 22 — ABNORMAL HIGH

## 2017-11-28 LAB — EGFR CKD-EPI AA MALE: Lab: 79

## 2017-11-28 LAB — LARGE UNSTAINED CELLS: Lab: 3

## 2017-11-28 LAB — ALT (SGPT): Alanine aminotransferase:CCnc:Pt:Ser/Plas:Qn:: 12 — ABNORMAL LOW

## 2017-11-28 LAB — AST (SGOT): Aspartate aminotransferase:CCnc:Pt:Ser/Plas:Qn:: 25

## 2017-11-28 NOTE — Unmapped (Signed)
Pt presents for Remicade infusion.  Pt denies any recent infection, VSS.  IV placed, labs drawn, premeds declined.  Pt aware of potential reaction/side effects, call bell within reach.  1140 Remicade 500mg  in started, to infuse at the following rates:  43ml/hr for 15 min  76ml/hr for 15 min  15ml/hr for 15 min  2ml/hr for 15 min  152ml/hr for 30 min  23ml/hr until complete  Pt reclined in chair, watching tv throughout infusion    1337 Infusion complete.  Pt tolerated without complication, VSS.  IV flushed per policy and d/c'd, gauze and coban applied.  Pt left clinic in no acute distress.

## 2017-12-24 NOTE — Unmapped (Signed)
[facility] Refill Coordination Note    Specialty Medication(s) to be Shipped:   Inflammatory Disorders: methotrexate vials    Other medication(s) to be shipped: folic acid, sharps container, syringes     Roy Kirk, DOB: Nov 24, 1955  Phone: [phone] (home)       All above HIPAA information was verified with patient.     Completed refill call assessment today to schedule patient's medication shipment from the [facility] [phone]).       Specialty medication(s) and dose(s) confirmed: Regimen is correct and unchanged.   Changes to medications: Roy Kirk reports no changes reported at this time.  Changes to insurance: No  Questions for the pharmacist: No    The patient will receive a drug information handout for each medication shipped and additional FDA Medication Guides as required.      DISEASE/MEDICATION-SPECIFIC INFORMATION        For Rheumatology patients: Next dose of methotrexate  from this shipment due on 11/13    ADHERENCE     No missed doses reported at this time    [facility]     Shipping address confirmed in Epic.     Delivery Scheduled: Yes, Expected medication delivery date: 11/8.  However, Rx request for refills was sent to the provider as there are none remaining. [sent refill request to [place] rheumatology clinic clinical staff pool per request of the clinic CPP    Medication will be delivered via UPS to the prescription address in Epic WAM.    Renette Butters   [facility] Specialty Technician

## 2017-12-25 MED ORDER — FOLIC ACID 1 MG TABLET
ORAL_TABLET | 3 refills | 0 days | Status: CP
Start: 2017-12-25 — End: 2018-09-25
  Filled 2017-12-27: qty 90, 90d supply, fill #0

## 2017-12-25 MED ORDER — METHOTREXATE SODIUM 25 MG/ML INJECTION SOLUTION
0 refills | 0 days | Status: CP
Start: 2017-12-25 — End: 2018-03-06

## 2017-12-25 NOTE — Unmapped (Signed)
Methotrexate refill  Last ov: Visit date not found  Next ov: 02/21/2018   Labs:   AST   Date Value Ref Range Status   11/28/2017 25 17 - 47 U/L Final     ALT   Date Value Ref Range Status   11/28/2017 12 (L) 13 - 69 U/L Final     Creatinine   Date Value Ref Range Status   11/28/2017 1.16 0.70 - 1.30 mg/dL Final     WBC   Date Value Ref Range Status   11/28/2017 6.3 4.5 - 11.0 10*9/L Final     HGB   Date Value Ref Range Status   11/28/2017 13.4 (L) 13.5 - 17.5 g/dL Final     HCT   Date Value Ref Range Status   11/28/2017 42.3 41.0 - 53.0 % Final     MCV   Date Value Ref Range Status   11/28/2017 92.9 80.0 - 100.0 fL Final     RDW   Date Value Ref Range Status   11/28/2017 14.4 12.0 - 15.0 % Final     Platelet   Date Value Ref Range Status   11/28/2017 228 150 - 440 10*9/L Final     Neutrophils %   Date Value Ref Range Status   11/28/2017 53.1 % Final     Lymphocytes %   Date Value Ref Range Status   11/28/2017 33.8 % Final     Monocytes %   Date Value Ref Range Status   11/28/2017 6.7 % Final     Eosinophils %   Date Value Ref Range Status   11/28/2017 2.5 % Final     Basophils %   Date Value Ref Range Status   11/28/2017 0.9 % Final

## 2017-12-27 MED FILL — FOLIC ACID 1 MG TABLET: 90 days supply | Qty: 90 | Fill #0 | Status: AC

## 2017-12-27 MED FILL — BD TUBERCULIN SYRINGE 1 ML 27 X 1/2": 84 days supply | Qty: 12 | Fill #1 | Status: AC

## 2017-12-27 MED FILL — EMPTY CONTAINER: 30 days supply | Qty: 1 | Fill #1

## 2017-12-27 MED FILL — BD TUBERCULIN SYRINGE 1 ML 27 X 1/2": 84 days supply | Qty: 12 | Fill #1

## 2017-12-27 MED FILL — METHOTREXATE SODIUM 25 MG/ML INJECTION SOLUTION: 84 days supply | Qty: 12 | Fill #0

## 2017-12-27 MED FILL — METHOTREXATE SODIUM 25 MG/ML INJECTION SOLUTION: 84 days supply | Qty: 12 | Fill #0 | Status: AC

## 2017-12-27 MED FILL — EMPTY CONTAINER: 30 days supply | Qty: 1 | Fill #1 | Status: AC

## 2018-01-23 ENCOUNTER — Ambulatory Visit: Admit: 2018-01-23 | Discharge: 2018-01-24 | Payer: MEDICARE

## 2018-01-23 DIAGNOSIS — M0579 Rheumatoid arthritis with rheumatoid factor of multiple sites without organ or systems involvement: Principal | ICD-10-CM

## 2018-01-23 LAB — EGFR CKD-EPI NON-AA MALE: Lab: 51 — ABNORMAL LOW

## 2018-01-23 LAB — CBC W/ AUTO DIFF
EOSINOPHILS ABSOLUTE COUNT: 0.2 10*9/L (ref 0.0–0.4)
EOSINOPHILS RELATIVE PERCENT: 2.8 %
HEMATOCRIT: 42.4 % (ref 41.0–53.0)
HEMOGLOBIN: 13.8 g/dL (ref 13.5–17.5)
LARGE UNSTAINED CELLS: 3 % (ref 0–4)
LYMPHOCYTES ABSOLUTE COUNT: 2 10*9/L (ref 1.5–5.0)
MEAN CORPUSCULAR HEMOGLOBIN CONC: 32.4 g/dL (ref 31.0–37.0)
MEAN CORPUSCULAR HEMOGLOBIN: 30.8 pg (ref 26.0–34.0)
MEAN CORPUSCULAR VOLUME: 94.8 fL (ref 80.0–100.0)
MEAN PLATELET VOLUME: 7.4 fL (ref 7.0–10.0)
MONOCYTES ABSOLUTE COUNT: 0.5 10*9/L (ref 0.2–0.8)
MONOCYTES RELATIVE PERCENT: 7.5 %
NEUTROPHILS ABSOLUTE COUNT: 3.8 10*9/L (ref 2.0–7.5)
NEUTROPHILS RELATIVE PERCENT: 57.1 %
PLATELET COUNT: 254 10*9/L (ref 150–440)
RED CELL DISTRIBUTION WIDTH: 13.7 % (ref 12.0–15.0)
WBC ADJUSTED: 6.7 10*9/L (ref 4.5–11.0)

## 2018-01-23 LAB — AST (SGOT): Aspartate aminotransferase:CCnc:Pt:Ser/Plas:Qn:: 30

## 2018-01-23 LAB — CREATININE: CREATININE: 1.47 mg/dL — ABNORMAL HIGH (ref 0.70–1.30)

## 2018-01-23 LAB — MONOCYTES ABSOLUTE COUNT: Lab: 0.5

## 2018-01-23 LAB — ALT (SGPT): Alanine aminotransferase:CCnc:Pt:Ser/Plas:Qn:: 21

## 2018-01-23 LAB — BLOOD UREA NITROGEN: Urea nitrogen:MCnc:Pt:Ser/Plas:Qn:: 23 — ABNORMAL HIGH

## 2018-01-23 NOTE — Unmapped (Signed)
Patient in today for Remicade infusion. Patient has no s/s of infection. No issues from the last infusion. Remicade started ,patient instructed to use call bell /call nurse for any s/s of unusual symptoms during infusion. Patient educated on possible s/s of reaction,such as chest pain ,itching,shortness of breath lightheadedness and any kind of discomfort. Patient verbalized understanding.  PIV #24G initiated to right forearm.  Labs drawn. Pt refused all pre-meds.  Remicade 500mg /250 NS started at 1138 and infused over 2hr protocol. Infusion completed and well tolerated. Pt alert and oriented with no complaints during infusion.   Remicade 2hr protocol  Start of infusion 10ml x                             20ml x                            40ml x                            80ml x                          x                          x till infusion complete.  PIV discontinued. Coban and gauze applied.

## 2018-01-25 NOTE — Unmapped (Signed)
Left voice message for pt that I was reviewing his recent labs from 01/23/2018 and Cr was up from prior labs (1.47 up from 1.16).   Recommended he repeat, will order for him to complete at [facility] or he can discuss with PCP - he has appt scheduled for 12/13.  I will also send him a letter and forward this note to his PCP.    Danella Maiers, MD, MSCI  January 25, 2018   3:20 PM

## 2018-02-07 NOTE — Unmapped (Signed)
Rescheduling refill call 84 day supply sent on 11/7    Everlean Cherry CPHT

## 2018-02-21 ENCOUNTER — Ambulatory Visit: Admit: 2018-02-21 | Discharge: 2018-02-22 | Payer: MEDICARE

## 2018-02-21 DIAGNOSIS — F319 Bipolar disorder, unspecified: Secondary | ICD-10-CM

## 2018-02-21 DIAGNOSIS — M059 Rheumatoid arthritis with rheumatoid factor, unspecified: Principal | ICD-10-CM

## 2018-02-21 DIAGNOSIS — R7989 Other specified abnormal findings of blood chemistry: Secondary | ICD-10-CM

## 2018-02-21 DIAGNOSIS — R768 Other specified abnormal immunological findings in serum: Secondary | ICD-10-CM

## 2018-02-21 DIAGNOSIS — F209 Schizophrenia, unspecified: Secondary | ICD-10-CM

## 2018-02-21 DIAGNOSIS — Z9119 Patient's noncompliance with other medical treatment and regimen: Secondary | ICD-10-CM

## 2018-02-21 NOTE — Unmapped (Signed)
REASON FOR VISIT: re-establish care    HISTORY: Roy Kirk is a 63 y.o. male with a hx of RA dx in 1992, serologies unknown. Has been treated with mtx 25 mg sq and remicade 6.5mg /kg q 8wks. 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 This is treated with topical therapies.   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   Additional hx of Hep C for which he has not been treated. He is followed by PCP for this.    Interim history:  Presents today to re-establish care. He was last seen over a year ago in 12/2017.     He states that he has been doing well in the interim with respect to his RA. He continues to feel the medications work well for him. He tolerates the mtx injections well, but he is unable to self inject, and requires his neighbor to come over and administer this once weekly.   He feels more stiffness in the week before his remicade infusion, but he does not want to increase the frequency with which he gets his infusions.   He requests that his order be changed to allow for rapid infusion of remicade.     Denies fevers or chills. He reports unintentional wt loss over the last year, though in review of weights in his chart, he has actually gained 3 lbs since October.     Cr was a little elevated when checked with last infusion. He did not f/u with PCP for this.     Still getting spots of psoriasis on palms and hands. Does not bother him. When he flares it is itchy and bothersome, but this is rare. He still uses topical steroids prn with relief.     He continues to struggle with his mental health. He is not seeing a psychiatrist. States that his PCP referred him to a psychiatrist, but they did not treat schizophrenia so they would not see him. He states that his PCP told him there was no where else she could send him. He declines a referral to [facility] psychiatry because the drive is too far.     No interim illness or hospitalization.     CURRENT MEDICATIONS:  Current Outpatient Medications   Medication Sig Dispense Refill   ??? folic acid (FOLVITE) 1 MG tablet TAKE 1 TABLET (1 MG TOTAL) BY MOUTH DAILY. 90 tablet 3   ??? INFLIXIMAB (REMICADE IV) Infuse into a venous catheter.     ??? methotrexate 25 mg/mL injection solution INJECT 1 ML (25 MG TOTAL) UNDER THE SKIN ONCE A WEEK. 12 mL 0     No current facility-administered medications for this visit.        Past Medical History:   Diagnosis Date   ??? ADHD    ??? Bipolar disorder (CMS-HCC)    ??? Hypertension    ??? Rheumatoid arthritis (CMS-HCC)    ??? Schizophrenia (CMS-HCC) 2006   ??? TB lung, latent        Record Review: Available records were reviewed, including pertinent office visits, labs, and imaging.      REVIEW OF SYSTEMS: Ten system were reviewed and negative except as noted above.    PHYSICAL EXAM:  VITAL SIGNS:   Vitals:    02/21/18 1308   BP: 157/86   BP Site: R Arm   BP Position: Sitting   BP Cuff Size: Medium   Pulse: 77   Temp: 36.1 ??C (97 ??F)  TempSrc: Oral   Weight: 75.6 kg (166 lb 11.2 oz)   Height: 175.3 cm (5' 9.02)     Wt Readings from Last 3 Encounters:   02/21/18 75.6 kg (166 lb 11.2 oz)   01/23/18 73.5 kg (162 lb)   11/28/17 73.9 kg (163 lb)       General:    62 y.o.male in no acute distress, WDWN   Eyes:   PERRL, conjunctiva and sclera not inflamed. Tears appear adequate.    ENT:   No oropharyngeal lesions. Mucous membranes moist.    Lymph:   No masses or cervical lymphadenopathy.    Cardiovascular:  Regular rate and rhythm. No murmur, rub, or gallop. No lower extremity edema.    Lungs:  Clear to auscultation.Normal respiratory effort.    Musculoskeletal:   General: Ambulates w/o assistance   Hands: No swelling or tenderness. Able to make a tight fist b/l   Wrists:FROM w/o swelling or tenderness   Elbows: FROM w/o swelling or tenderness   Shoulders: FROM w/o pain   Hips: FROM w/o pain   Knees: FROM w/o effusions   Ankles: No swelling or tenderness   Feet: No pain with MTP squeeze    Neurological:  CN 2-12 grossly intact. 5/5 strength on extremities.   Psych:  Appropriate affect and mood   Skin:  Palms without lesions. Soles of feet dry with flaking, but no pustules.      Swollen Joint Count (0-28): 0  Tender Joint Count (0-28): 0  Patient Global Assessment of Disease Activity (1-10): 0  Evaluator Global Assessment of Disease (1-10): 2  CDAI Score: 2  CDAI interpretation:  0.0-2.8 Remission   2.9-10.0 Low disease activity   10.1-22.0 Moderate disease activity   22.1-76.0 High disease activity           ASSESSMENT/PLAN:   1. Seropositive rheumatoid arthritis (CMS-HCC)  Appearing well controlled w/o synovitis on exam. He endorses feeling that the infusions wear off in the week before he is due for next infusion, but does not want to receive infusions more frequently.   Continue remicade 6.5mg /kg. Order changed to allow rapid infusion per his request. He declines any premedications, reports he has been refusing these at each infusion visit.   We discussed methotrexate. He is unable to render the injections himself, and has a neighbor administer these. He does not want to change to mtx pills because that is too many pills to take at once. Per his wishes, will remain on mtx 25 mg SQ qwk, FA 1 mg qd.   - XR Hand 2 Views Bilateral; Future    2. Hepatitis C antibody test positive  Viral load has been negative despite immunosuppressive medication use. Recheck viral load with upcoming infusion.     3. Elevated serum creatinine  Discussed rechecking this, pt wants to wait until he gets his infusion later this month to recheck this. Order for repeat Cr placed with his infusion. Encouraged good hydration and f/u with PCP for this.     4. Schizophrenia and bipolar  Pt reports that his mental health conditions remain untreated. He is not really interested in pursuing therapy. He does not want to drive to [facility] for psychiatry and declines a referral to [facility]. He states that there is no psychiatry provider closer to him that will see him for his psychiatric conditions.     5. Non-compliance to office f/u   We discussed the importance of compliance to office visit f/u. He  remains calm and cooperative, but is unhappy that he has to follow up with Dr Sullivan Lone because she has previously warned him that he would be unable to continue receiving remicade infusions if he failed to office visits (pt had repeated no-shows had had not been seen in office in over a year). We discussed at length that this is not a rule that Dr Sullivan Lone made up, but is a clinic-wide guideline that must be followed by every patient taking immunosuppressive medications. Because of the nature of the medications he takes, he should be evaluated in clinic at minimum 1-2 times yearly.         HCM:   - PCV13 Status:declines  - PPSV 23 Status: declines  - Annual Influenza vaccine. Status: declines  - Bone health: not on prednisone       Return appointment in 6 mo with Dr Sullivan Lone     25 minutes was spent with the patient, over half of which was counseling regarding dx and treatment plan.

## 2018-03-07 MED ORDER — METHOTREXATE SODIUM 25 MG/ML INJECTION SOLUTION
0 refills | 0 days | Status: CP
Start: 2018-03-07 — End: 2018-09-25

## 2018-03-07 NOTE — Unmapped (Signed)
Methotrexate refill  Last ov: 02/21/2018  Next ov: Visit date not found   Labs:   AST   Date Value Ref Range Status   01/23/2018 30 17 - 47 U/L Final     ALT   Date Value Ref Range Status   01/23/2018 21 <50 U/L Final     Creatinine   Date Value Ref Range Status   01/23/2018 1.47 (H) 0.70 - 1.30 mg/dL Final     WBC   Date Value Ref Range Status   01/23/2018 6.7 4.5 - 11.0 10*9/L Final     HGB   Date Value Ref Range Status   01/23/2018 13.8 13.5 - 17.5 g/dL Final     HCT   Date Value Ref Range Status   01/23/2018 42.4 41.0 - 53.0 % Final     MCV   Date Value Ref Range Status   01/23/2018 94.8 80.0 - 100.0 fL Final     RDW   Date Value Ref Range Status   01/23/2018 13.7 12.0 - 15.0 % Final     Platelet   Date Value Ref Range Status   01/23/2018 254 150 - 440 10*9/L Final     Neutrophils %   Date Value Ref Range Status   01/23/2018 57.1 % Final     Lymphocytes %   Date Value Ref Range Status   01/23/2018 29.5 % Final     Monocytes %   Date Value Ref Range Status   01/23/2018 7.5 % Final     Eosinophils %   Date Value Ref Range Status   01/23/2018 2.8 % Final     Basophils %   Date Value Ref Range Status   01/23/2018 0.6 % Final

## 2018-03-15 NOTE — Unmapped (Signed)
The [facility] has made a third and final attempt to reach this patient to refill the following medication:methotrexate vials.      We have Left voicemails on the following phone numbers: [phone] , and  [phone]  Was d/c or changed; [phone] said he wasn't Jjesus; [phone] no vm option    Dates contacted: 1/15, 1/21, 1/24   Last scheduled delivery: 11/7 (84 day supply)    The patient may be at risk of non-compliance with this medication. The patient should call the [facility] at [phone] (option 4) to refill medication.    Renette Butters   [facility] Specialty Technician

## 2018-03-20 ENCOUNTER — Encounter: Admit: 2018-03-20 | Discharge: 2018-03-21 | Payer: MEDICARE

## 2018-03-20 DIAGNOSIS — R768 Other specified abnormal immunological findings in serum: Principal | ICD-10-CM

## 2018-03-20 DIAGNOSIS — M0579 Rheumatoid arthritis with rheumatoid factor of multiple sites without organ or systems involvement: Secondary | ICD-10-CM

## 2018-03-20 LAB — CBC W/ AUTO DIFF
BASOPHILS ABSOLUTE COUNT: 0 10*9/L (ref 0.0–0.1)
BASOPHILS RELATIVE PERCENT: 0.5 %
EOSINOPHILS RELATIVE PERCENT: 1.2 %
HEMATOCRIT: 43 % (ref 41.0–53.0)
HEMOGLOBIN: 13.7 g/dL (ref 13.5–17.5)
LYMPHOCYTES ABSOLUTE COUNT: 2.1 10*9/L (ref 1.5–5.0)
LYMPHOCYTES RELATIVE PERCENT: 28.8 %
MEAN CORPUSCULAR HEMOGLOBIN CONC: 32 g/dL (ref 31.0–37.0)
MEAN CORPUSCULAR HEMOGLOBIN: 30 pg (ref 26.0–34.0)
MEAN CORPUSCULAR VOLUME: 93.7 fL (ref 80.0–100.0)
MEAN PLATELET VOLUME: 9.5 fL (ref 7.0–10.0)
MONOCYTES ABSOLUTE COUNT: 0.6 10*9/L (ref 0.2–0.8)
NEUTROPHILS ABSOLUTE COUNT: 4.4 10*9/L (ref 2.0–7.5)
NEUTROPHILS RELATIVE PERCENT: 59.8 %
PLATELET COUNT: 227 10*9/L (ref 150–440)
RED BLOOD CELL COUNT: 4.59 10*12/L (ref 4.50–5.90)
RED CELL DISTRIBUTION WIDTH: 13.7 % (ref 12.0–15.0)
WBC ADJUSTED: 7.4 10*9/L (ref 4.5–11.0)

## 2018-03-20 LAB — BLOOD UREA NITROGEN: Urea nitrogen:MCnc:Pt:Ser/Plas:Qn:: 18

## 2018-03-20 LAB — EGFR CKD-EPI AA MALE: Lab: 80

## 2018-03-20 LAB — AST (SGOT): Aspartate aminotransferase:CCnc:Pt:Ser/Plas:Qn:: 38

## 2018-03-20 LAB — CREATININE
EGFR CKD-EPI AA MALE: 80 mL/min/{1.73_m2} (ref >=60)
EGFR CKD-EPI NON-AA MALE: 70 mL/min/{1.73_m2} (ref >=60)

## 2018-03-20 LAB — ALT (SGPT): Alanine aminotransferase:CCnc:Pt:Ser/Plas:Qn:: 22

## 2018-03-20 LAB — MONOCYTES RELATIVE PERCENT: Lab: 7.6

## 2018-03-20 NOTE — Unmapped (Signed)
1111 Pt is here for the Acc.Remicade infusion, denies any changes from the last treatment, is AAOX3 upon arrival, vitals  checked and call bell at the chair side and pt instructed to use it when needed, pt verbalizes understanding  1120 IV #24 initiated to the rt forearm and flushed, No pre-meds   1130 IV Remicade 500 mg/ 250 ml started @    100 ml/hr for 15 min    300 ml/hr for the rest of the infusion   Vitals checked as per the protocol  1232 Iv infusion done and flushed, denies any s/s of allergic reaction, tolerated the infusion well  1236 Iv discontinued and dressing applied, pt given follow up instructions and is stable upon discharge

## 2018-03-22 LAB — HEPATITIS C RNA, QUANTITATIVE, PCR

## 2018-03-22 LAB — HCV RNA LOG10: Lab: 0

## 2018-04-09 NOTE — Unmapped (Signed)
[facility] Refill and Clinical Assessment Telephone Call   Medication: subq methotrexate    Unable to reach patient to schedule shipment and complete required clinical assessment for subq methotrexate filled through the [facility].     Fourth attempt to reach patient for refill (04/09/18), first attempt for clinical assessment (04/09/18).    Fifth attempt to reach patient for refill (04/16/18), second attempt for clinical assessment (04/16/18).    [phone] - a gentleman picked up the phone but states he is not [name] and does not know who that is   - [phone] - number changed, disconnected or no longer in service   - [phone] - voicemail not set up     Patient's PAP application expired on 02/19/18 and will need to be renewed before [facility] can send out any future medication.    Given multiple attempts to contact patient and method of contacts are not reliable, will disenroll patient at this time.        Jeneen Montgomery

## 2018-06-04 ENCOUNTER — Encounter: Admit: 2018-06-04 | Discharge: 2018-06-05 | Payer: MEDICARE

## 2018-06-04 DIAGNOSIS — R768 Other specified abnormal immunological findings in serum: Principal | ICD-10-CM

## 2018-06-04 DIAGNOSIS — M0579 Rheumatoid arthritis with rheumatoid factor of multiple sites without organ or systems involvement: Secondary | ICD-10-CM

## 2018-07-31 ENCOUNTER — Encounter: Admit: 2018-07-31 | Discharge: 2018-08-01 | Payer: MEDICARE

## 2018-07-31 DIAGNOSIS — M0579 Rheumatoid arthritis with rheumatoid factor of multiple sites without organ or systems involvement: Secondary | ICD-10-CM

## 2018-07-31 DIAGNOSIS — R768 Other specified abnormal immunological findings in serum: Principal | ICD-10-CM

## 2018-08-05 ENCOUNTER — Other Ambulatory Visit: Payer: Self-pay

## 2018-08-05 ENCOUNTER — Ambulatory Visit: Payer: Medicare HMO | Admitting: Licensed Clinical Social Worker

## 2018-09-02 ENCOUNTER — Ambulatory Visit (INDEPENDENT_AMBULATORY_CARE_PROVIDER_SITE_OTHER): Payer: Medicare HMO | Admitting: Licensed Clinical Social Worker

## 2018-09-02 ENCOUNTER — Other Ambulatory Visit: Payer: Self-pay

## 2018-09-02 DIAGNOSIS — F250 Schizoaffective disorder, bipolar type: Secondary | ICD-10-CM

## 2018-09-21 IMAGING — CR DG CHEST 2V
1 series · 2 of 2 positions shown · non-contrast
Comparison: Chest x-ray dated May 07, 2016.

CLINICAL DATA: Chest pain for the past 3 days.

EXAM:
CHEST  2 VIEW

[Series 1: dg chest 2 view · 0.14mm/px · 2 of 2 slices shown]
[im 1/2]
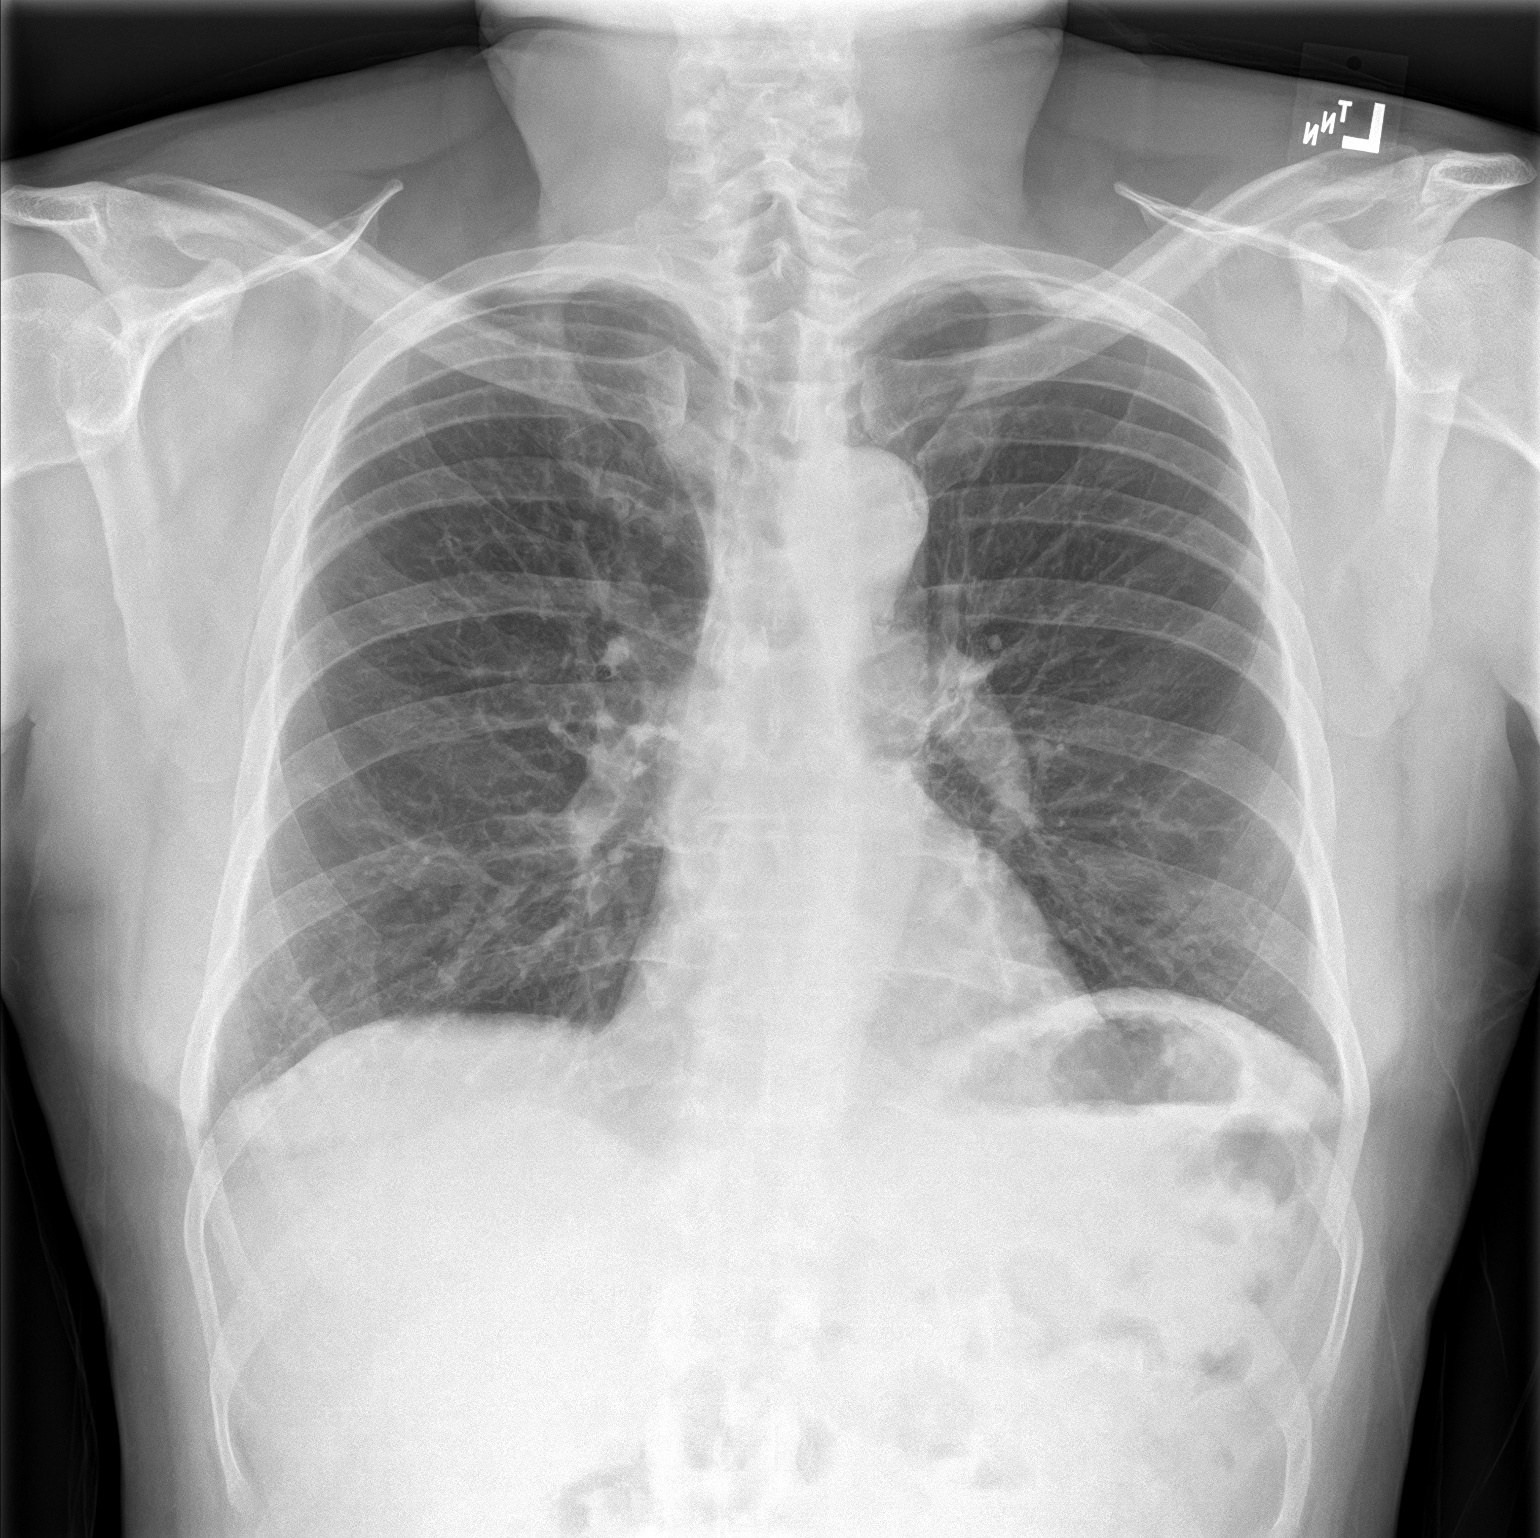
[im 2/2]
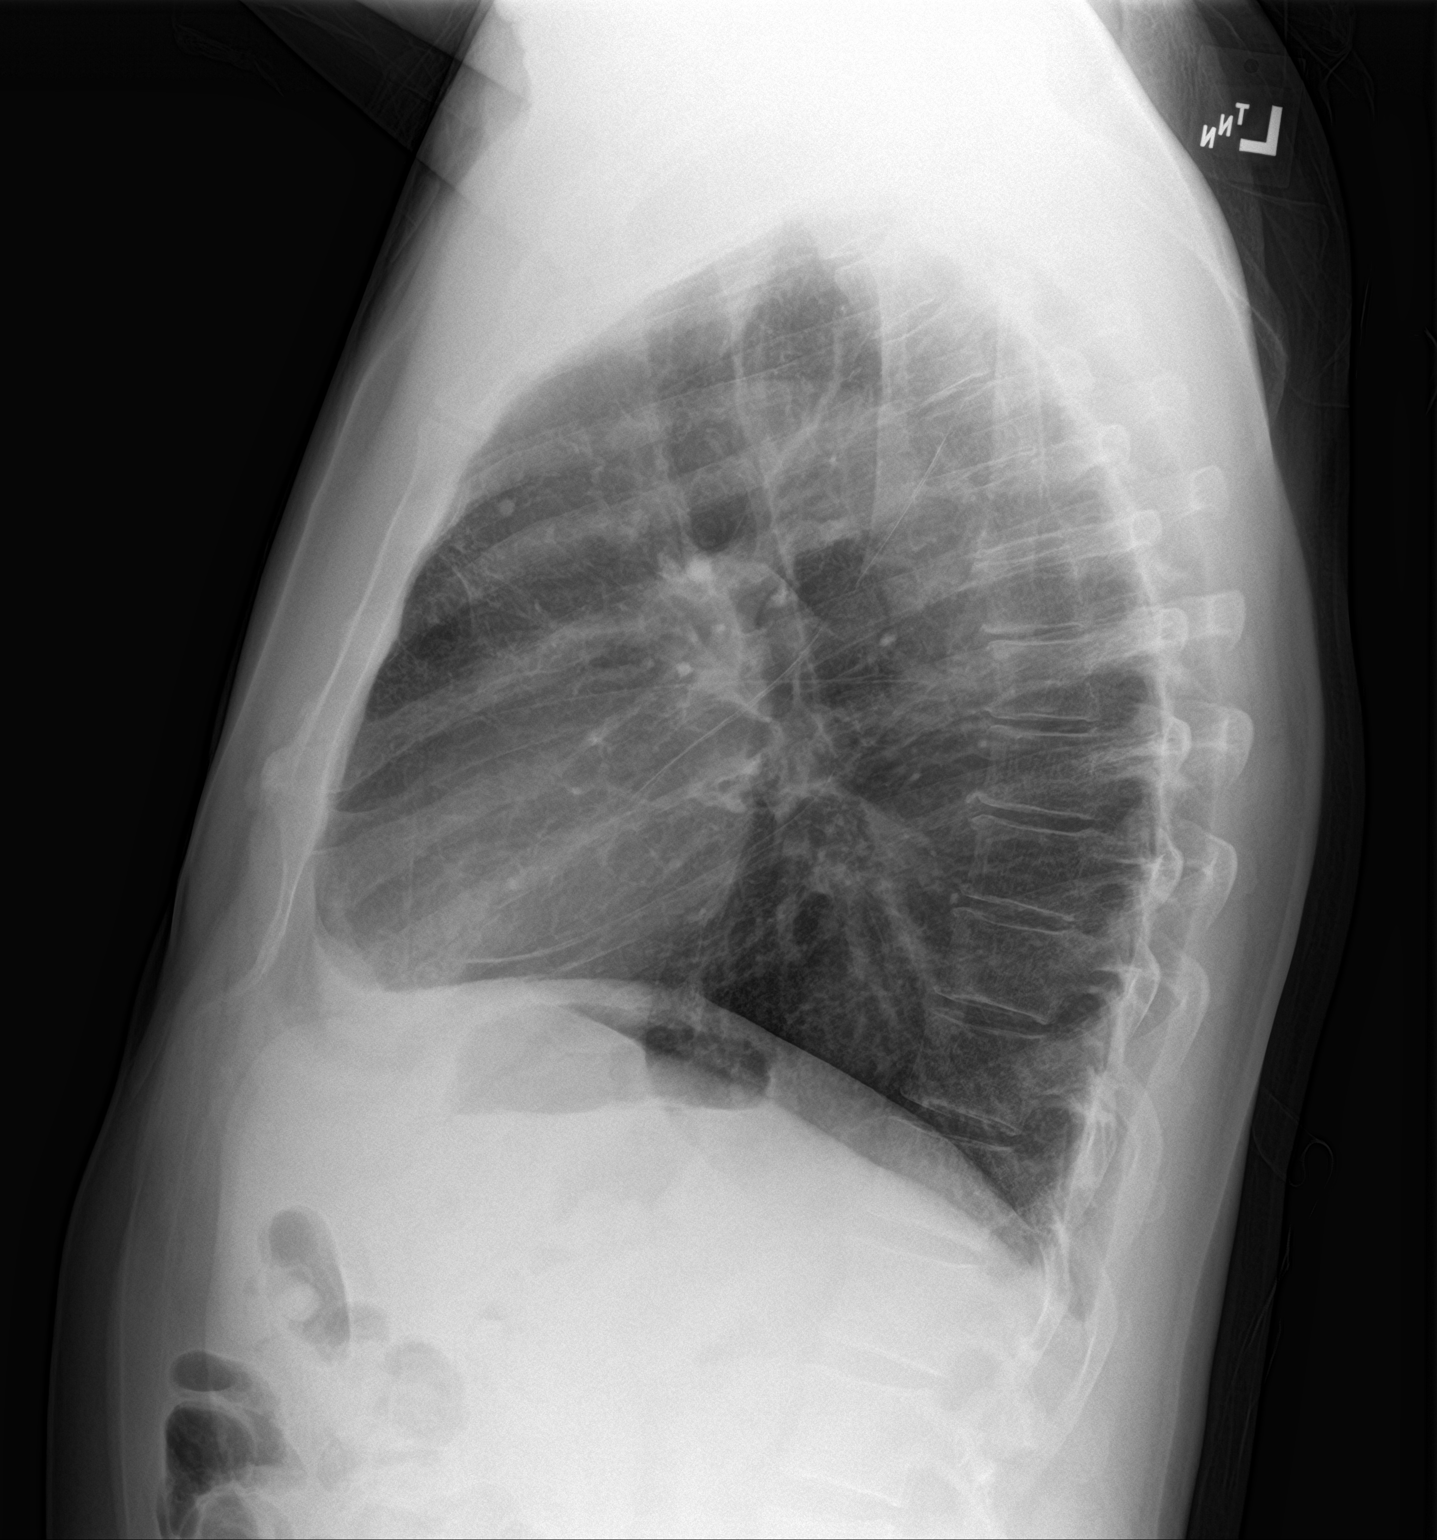

[2 of 2 positions shown; findings below may reference images not displayed]

FINDINGS: The heart size and mediastinal contours are within normal limits.
Unchanged calcified hilar lymph nodes and granulomas in both lungs.
No focal consolidation, pleural effusion, or pneumothorax. The
visualized skeletal structures are unremarkable.
IMPRESSION: No active cardiopulmonary disease.

## 2018-09-25 ENCOUNTER — Encounter: Admit: 2018-09-25 | Discharge: 2018-09-26 | Payer: MEDICARE

## 2018-09-25 DIAGNOSIS — R768 Other specified abnormal immunological findings in serum: Principal | ICD-10-CM

## 2018-09-25 DIAGNOSIS — M0579 Rheumatoid arthritis with rheumatoid factor of multiple sites without organ or systems involvement: Secondary | ICD-10-CM

## 2018-09-25 MED ORDER — FOLIC ACID 1 MG TABLET
ORAL_TABLET | 3 refills | 0 days | Status: CP
Start: 2018-09-25 — End: 2019-09-25

## 2018-09-25 MED ORDER — METHOTREXATE SODIUM 25 MG/ML INJECTION SOLUTION
0 refills | 0 days | Status: CP
Start: 2018-09-25 — End: 2019-09-25

## 2018-09-30 ENCOUNTER — Ambulatory Visit (INDEPENDENT_AMBULATORY_CARE_PROVIDER_SITE_OTHER): Payer: Medicare HMO | Admitting: Psychiatry

## 2018-09-30 ENCOUNTER — Other Ambulatory Visit: Payer: Self-pay

## 2018-09-30 DIAGNOSIS — F4310 Post-traumatic stress disorder, unspecified: Secondary | ICD-10-CM

## 2018-09-30 NOTE — Progress Notes (Signed)
Patient arrived 30 minutes late for appointment. Will have him reschedule.

## 2018-10-24 ENCOUNTER — Encounter: Admit: 2018-10-24 | Discharge: 2018-10-25 | Payer: MEDICARE

## 2018-10-24 MED ORDER — FOLIC ACID 1 MG TABLET
ORAL_TABLET | Freq: Every day | ORAL | 3 refills | 90 days | Status: CP
Start: 2018-10-24 — End: 2019-10-24
  Filled 2018-10-30: qty 90, 90d supply, fill #0

## 2018-10-24 MED ORDER — METHOTREXATE SODIUM 25 MG/ML INJECTION SOLUTION
SUBCUTANEOUS | 0 refills | 84 days | Status: CP
Start: 2018-10-24 — End: 2019-10-24

## 2018-10-24 MED ORDER — EMPTY CONTAINER
PRN refills | 0 days
Start: 2018-10-24 — End: ?

## 2018-10-24 MED ORDER — SYRINGE WITH NEEDLE 1 ML 27 X 1/2"
0 refills | 0 days | Status: CP
Start: 2018-10-24 — End: ?

## 2018-10-24 NOTE — Unmapped (Signed)
Rheumatology Remote Clinic Follow-up Appointment     Patient self identified using name and date of birth.    Patient location: Work (alone in private area)     Last clinic visit: January 2020 with Carlus Pavlov, PA-C    Reason for encounter: Follow-up seropositive rheumatoid arthritis    HISTORY: Roy Kirk is a 63 y.o. male with a hx of RA dx in 1992, serologies unknown. Has been treated with mtx 25 mg sq and remicade 6.5mg /kg q 8wks. 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 This is treated with topical therapies. 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  Additional hx of Hep C for which he has not been treated.     Interval History:  - Last infliximab infusion 09/25/2018.  Continues to receive 6.5 mg/kg every 8 weeks.  - Remains on methotrexate 25 mg SQ weekly administered by his neighbor. Out of methotrexate and folic acid for past month.  He reports [facility]  (I had refilled on [facility]/25/[facility] [place] [place]  - He reports some R hip pain on the side the week prior to next infusion. Not unbearable. No pain in hands.  Some pain in his feet worse in the evening.   - AM stiffness for less than a minute.  - No psoriasis past year.   - Tired of being stuck in house for Covid. 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   - No fevers, chills, mouth sores, nausea, cough, rash, or other concerns. No recent infections or antibiotic use.    - Saw PCP in July for anxiety, prescribed lorazepam for 30 days which has now run out. Referred to psych but hard to get in, referred to [facility].   - Was living with mother who now moved [place].     Objective    Physical Exam:  GENERAL: The patient does not sound to be in acute distress.   Respiratory: Speaks in full sentences. No cough.   PSYCH: No depression or anxiety. Cooperative. Alert and oriented.       ASSESSMENT/PLAN:  Roy Kirk is a 63 y.o. male with a PMH of seropositive RA who is here for follow up. The patient has been relatively well controlled on his current regimen of methotrexate 25mg  SQ weekly and infliximab 6.5 mg every 8 weeks for several years. He has had palmoplantar psoriasis related to monthly anti-TNF therapy, but now seems to be minimal after decreased to every 8 weeks (instead of every 4 weeks). Today, the patient reports he is doing well with minimal AM stiffness.   ??  RA: well controlled on current therapy per pt report.  - Continue infliximab 6.5mg /kg q 8 weeks, as this dose and schedule is effective for him while minimizing infusion-related psoriasis.   - Continue methotrexate 25mg  SQ weekly, administered at home by friend who is Char[employer] fundraiser. Refill sent to [facility] pharmacy at his request. Cont daily folic acid with methotrexate.     TNF-related palmoplantar psoriasis:  Seems to be related to infusion frequency, significantly improved with decreased frequency of infusions to q8 week schedule. Pt reports now resolved.   - Will continue to monitor. If progressive, will likely need to consider alternative medication for RA.    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 Given pain on lateral thigh, continue to favor this diagnosis though unusual that gets  worse as approaches infliximab infusions. Will continue to monitor for now.   ??  Hep C Ab +: negative viral load. Has never had imaging for [facility] or cirrhosis.  - followed by PCP.     High risk medication monitoring:   - Patient is currently taking methotrexate and infliximab, immunosuppressant medications that requires intensive monitoring. This monitoring was done today through history, physical, and/or lab testing.   - No recent infections or hospitalizations.   - Will check CBC and CMP with infusions.     Follow-up: 9 months with Carlus Pavlov PA-c     Danella Maiers, MD, MSCI  Assistant Professor of Medicine  Department of Medicine/Division of Rheumatology  [place] [facility]  [phone] clinic phone  [phone] clinic secure fax       I spent 13 minutes on the phone with the patient. I spent an additional 10 minutes on pre- and post-visit activities.     The patient was physically located in [place] or a state in which I am permitted to provide care. The patient and/or parent/guardian understood that s/he may incur co-pays and cost sharing, and agreed to the telemedicine visit. The visit was reasonable and appropriate under the circumstances given the patient's presentation at the time.    The patient and/or parent/guardian has been advised of the potential risks and limitations of this mode of treatment (including, but not limited to, the absence of in-person examination) and has agreed to be treated using telemedicine. The patient's/patient's family's questions regarding telemedicine have been answered.     If the visit was completed in an ambulatory setting, the patient and/or parent/guardian has also been advised to contact their provider???s office for worsening conditions, and seek emergency medical treatment and/or call 911 if the patient deems either necessary.

## 2018-10-24 NOTE — Unmapped (Signed)
[facility] Specialty Medication Referral: No PA required    Medication (Brand/Generic): Methotrexate 25 mg/mL Injection    Initial Benefits Investigation Claim completed with resulted information below:  No PA required  Patient ABLE to fill at [facility]  Insurance Company:  [employer] Part D  Anticipated Copay: $3.60    As Co-pay is under $25 defined limit, per policy there will be no further investigation of need for financial assistance at this time unless patient requests. This referral has been communicated to the provider and handed off to the [facility] team for further processing and filling of prescribed medication.   ______________________________________________________________________  Please utilize this referral for viewing purposes as it will serve as the central location for all relevant documentation and updates.

## 2018-10-25 NOTE — Unmapped (Signed)
[facility]   Patient Onboarding/Medication Counseling    Roy Kirk is a 63 y.o. male with rheumatoid arthritis who I am counseling today on continuation of therapy.  I am speaking to the patient.    Verified patient's date of birth / HIPAA.    Specialty medication(s) to be sent: Inflammatory Disorders: methotrexate (injectable)      Non-specialty medications/supplies to be sent: folic acid, syringes, sharps kit       methotrexate injection    Medication & Administration     Dosage: Inject 1mL (25mg ) subcutaneously every 7 days      Lab tests required prior to treatment initiation:  ? Pregnancy: Patient is male: screening not applicable.      Administration:     Vials and syringes  1. Gather all supplies needed for injection on a clean, flat working surface: medication vial, syringe with needle, alcohol swabs, sharps container, etc.  2. Look at the medication label ??? look for correct medication, correct dose, and check the expiration date  3. Look at the medication ??? the liquid in the syringe should appear clear and yellow in color  4. Select injection site ??? you can use the front of your thigh or your belly (but not the area 2 inches around your belly button)  5. Prepare injection site ??? wash your hands and clean the skin at the injection site with an alcohol swab and let it air dry, do not touch the injection site again before the injection  6. Pop the protective cap off the top of the vial if it's the first use for that vial and wipe the rubber stopper with an alcohol wipe and allow to air dry  7. Open syringe and needle and carefully remove the needle cap, pull the plunger back to the volume you'll need to inject  8. With the methotrexate vial on a flat surface, insert the needle straight through the middle of the rubber stopper until the needle is all the way down, push the plunger until all the air is injected into the vial  9. Hold the vial and syringe and turn them upside down while keeping the needle in the vial, pull down the plunger to withdraw the desired volume of liquid from the vial, carefully remove the needle from the vial  10. Pinch the skin ??? with your hand not holding the syringe pinch up a fold of skin at the injection site using your forefinger and thumb  11. Insert the needle into the fold of skin at about a 45 degree angle ??? it's best to use a quick dart-like motion ??? with the needle inserted, release the pinch of skin and allow the skin to relax  12. Push the plunger down slowly as far as it will go until the syringe is empty  13. Check that the syringe is empty and carefully pull the needle out at the same angle as inserted; after the needle is removed completely from the skin  14. Dispose of the used syringe immediately in your sharps disposal container  15. If you see any blood at the injection site, press a cotton ball or gauze on the site and maintain pressure until the bleeding stops, do not rub the injection site      Adherence/Missed dose instructions:  If your injection is given more than 2 days after your scheduled injection date ??? consult your pharmacist for additional instructions on how to adjust your dosing schedule.  Goals of Therapy     ? Achieve symptom remission  ? Slow disease progression  ? Protection of remaining articular structures  ? Maintenance of function  ? Maintenance of effective psychosocial functioning      Side Effects & Monitoring Parameters     ? Upset stomach, diarrhea, nausea or throwing up  ? Feeling dizzy, tired, or weak  ? Hair thinning (reversible)  ? Minor cold-like symptoms  ? Photosensitivity ??? use sunscreen and avoid prolonged exposure    The following side effects should be reported to the provider:  ? Signs of a hypersensitivity reaction ??? rash; hives; itching; red, swollen, blistered, or peeling skin; wheezing; tightness in the chest or throat; difficulty breathing, swallowing, or talking; swelling of the mouth, face, lips, tongue, or throat; etc.  ? Reduced immune function ??? report signs of infection such as fever; chills; body aches; very bad sore throat; ear or sinus pain; cough; more sputum or change in color of sputum; pain with passing urine; wound that will not heal, etc.  Also at a slightly higher risk of some malignancies (mainly skin and blood cancers) due to this reduced immune function.  o In the case of signs of infection ??? the patient should hold the next dose of methotrexate and call your primary care provider to ensure adequate medical care.  Treatment may be resumed when infection is treated and patient is asymptomatic.  ? Signs of bleeding ??? throwing up or coughing up blood; blood in the urine; black, red, or tarry stool; abnormal vaginal bleeding; bruises without a cause or that get bigger  ? Signs of pancreatitis ??? sudden very bad stomach pain, unexplained vomiting  ? Signs of kidney dysfunction ??? unable to pass urine; blood in the urine; sudden weight gain  ? Signs of liver dysfunction ??? darkened urine; upset stomach; light-colored stool; yellow skin or eyes  ? Signs of nerve problems ??? burning; numbness; tingling  ? Pinpoint red spots on the skin  ? Changes in eyesight      Contraindications, Warnings, & Precautions     ? Have your bloodwork checked as you have been told by your prescriber  ? Talk with your doctor if you are pregnant, planning to become pregnant, or breastfeeding ??? women taking this medication must use birth control while taking this drug and for some time after the last dose  ? Discuss the possible need for holding your dose(s) of methotrexate when a planned procedure is scheduled with the prescriber as it may delay healing/recovery timeline       Drug/Food Interactions     ? Medication list reviewed in Epic. The patient was instructed to inform the care team before taking any new medications or supplements. No drug interactions identified.     Storage, Handling Precautions, & Disposal     ? Store intact vials at room temperature  ? Protect from light  ? Dispose of used syringes in a sharps disposal container  ? 50mg /63ml vials contain preservative and can be used as a multi-dose vial to be discarded 30 days after the first puncture if stored in refrigeration and protected from light    ** Due to having taken this medication for quite some time and feeling comfortable with the information, the patient declined the following portions of the counseling outlined above: administration; adherence / missed dose instructions; goals of therapy; side effects and monitoring parameters; contraindications, warnings, and precautions; drug/food interactions; storage, handling precautions, and disposal **  Current Medications (including OTC/herbals), Comorbidities and Allergies     Current Outpatient Medications   Medication Sig Dispense Refill   ??? folic acid (FOLVITE) 1 MG tablet Take 1 tablet (1 mg total) by mouth daily. 90 tablet 3   ??? INFLIXIMAB (REMICADE IV) Infuse into a venous catheter.     ??? LORazepam (ATIVAN) 1 MG tablet TAKE 1 TABLET (1 MG TOTAL) BY MOUTH EVERY 12 (TWELVE) HOURS AS NEEDED FOR ANXIETY FOR UP TO 10 DAYS     ??? methotrexate 25 mg/mL injection solution Inject 1 mL (25 mg total) under the skin every seven (7) days. 12 mL 0   ??? syringe with needle 1 mL 27 x 1/2 Syrg Use 1 each every seven (7) days as directed. 50 each 0     No current facility-administered medications for this visit.        No Known Allergies    Patient Active Problem List   Diagnosis   ??? Rheumatoid arthritis involving multiple sites with positive rheumatoid factor (CMS-HCC)   ??? Rheumatoid arthritis involving multiple sites with positive rheumatoid factor (CMS-HCC)   ??? Immunization counseling   ??? High risk medication use   ??? Hepatitis C antibody test positive       Reviewed and up to date in Epic.    Appropriateness of Therapy     Is medication and dose appropriate based on diagnosis? Yes    Baseline Quality of Life Assessment How many days over the past month did your rheumatoid arhtritis keep you from your normal activities? Roy Kirk was unable to quantify this    Financial Information     Medication Assistance provided: None Required    Anticipated copay of $3.60 reviewed with patient. Verified delivery address.    Delivery Information     Scheduled delivery date: 10/31/2018    Expected start date: 10/31/2018    Medication will be delivered via [employer] Day Courier to the home address in [place].  This shipment will not require a signature.      Explained the services we provide at [facility] and that each month we would call to set up refills.  Stressed importance of returning phone calls so that we could ensure they receive their medications in time each month.  Informed patient that we should be setting up refills 7-10 days prior to when they will run out of medication.  A pharmacist will reach out to perform a clinical assessment periodically.  Informed patient that a welcome packet and a drug information handout will be sent.      Patient verbalized understanding of the above information as well as how to contact the pharmacy at [phone] option 4 with any questions/concerns.  The pharmacy is open Monday through Friday 8:30am-4:30pm.  A pharmacist is available 24/7 via pager to answer any clinical questions they may have.    Patient Specific Needs     ? Does the patient have any physical, cognitive, or cultural barriers? No    ? Patient prefers to have medications discussed with  Patient     ? Is the patient able to read and understand education materials at a high school level or above? Yes    ? Patient's primary language is  English     ? Is the patient high risk? No     ? Does the patient require a Care Management Plan? No     ? Does the patient require physician intervention or other  additional services (i.e. nutrition, smoking cessation, social work)? No      Roy Kirk  [facility] Specialty Pharmacist

## 2018-10-30 MED FILL — BD TUBERCULIN SYRINGE 1 ML 27 X 1/2": 84 days supply | Qty: 12 | Fill #0 | Status: AC

## 2018-10-30 MED FILL — BD TUBERCULIN SYRINGE 1 ML 27 X 1/2": 84 days supply | Qty: 12 | Fill #0

## 2018-10-30 MED FILL — FOLIC ACID 1 MG TABLET: 90 days supply | Qty: 90 | Fill #0 | Status: AC

## 2018-10-30 MED FILL — EMPTY CONTAINER: 30 days supply | Qty: 1 | Fill #0

## 2018-10-30 MED FILL — METHOTREXATE SODIUM 25 MG/ML INJECTION SOLUTION: 84 days supply | Qty: 12 | Fill #0 | Status: AC

## 2018-10-30 MED FILL — METHOTREXATE SODIUM 25 MG/ML INJECTION SOLUTION: SUBCUTANEOUS | 84 days supply | Qty: 12 | Fill #0

## 2018-10-30 MED FILL — EMPTY CONTAINER: 30 days supply | Qty: 1 | Fill #0 | Status: AC

## 2018-11-07 ENCOUNTER — Other Ambulatory Visit: Payer: Self-pay

## 2018-11-07 ENCOUNTER — Encounter: Payer: Self-pay | Admitting: Psychiatry

## 2018-11-07 ENCOUNTER — Ambulatory Visit (INDEPENDENT_AMBULATORY_CARE_PROVIDER_SITE_OTHER): Payer: Medicare HMO | Admitting: Psychiatry

## 2018-11-07 DIAGNOSIS — F1220 Cannabis dependence, uncomplicated: Secondary | ICD-10-CM | POA: Diagnosis not present

## 2018-11-07 DIAGNOSIS — F250 Schizoaffective disorder, bipolar type: Secondary | ICD-10-CM | POA: Diagnosis not present

## 2018-11-07 DIAGNOSIS — F4310 Post-traumatic stress disorder, unspecified: Secondary | ICD-10-CM | POA: Diagnosis not present

## 2018-11-07 DIAGNOSIS — Z79899 Other long term (current) drug therapy: Secondary | ICD-10-CM | POA: Diagnosis not present

## 2018-11-07 MED ORDER — OLANZAPINE 2.5 MG PO TABS: 2.5000 mg | ORAL_TABLET | Freq: Every day | ORAL | 1 refills | Status: DC

## 2018-11-07 MED ORDER — HYDROXYZINE PAMOATE 25 MG PO CAPS: 25.0000 mg | ORAL_CAPSULE | Freq: Every day | ORAL | 1 refills | Status: AC | PRN
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
## 2018-11-14 ENCOUNTER — Other Ambulatory Visit: Payer: Self-pay

## 2018-11-14 ENCOUNTER — Ambulatory Visit: Payer: Medicare HMO | Admitting: Licensed Clinical Social Worker

## 2018-11-20 ENCOUNTER — Encounter: Admit: 2018-11-20 | Discharge: 2018-11-21 | Payer: MEDICARE

## 2018-11-20 DIAGNOSIS — M0579 Rheumatoid arthritis with rheumatoid factor of multiple sites without organ or systems involvement: Secondary | ICD-10-CM

## 2018-11-20 DIAGNOSIS — R768 Other specified abnormal immunological findings in serum: Secondary | ICD-10-CM

## 2018-11-20 LAB — BLOOD UREA NITROGEN: Urea nitrogen:MCnc:Pt:Ser/Plas:Qn:: 18

## 2018-11-20 LAB — CBC W/ AUTO DIFF
BASOPHILS ABSOLUTE COUNT: 0.1 10*9/L (ref 0.0–0.1)
BASOPHILS RELATIVE PERCENT: 0.8 %
EOSINOPHILS ABSOLUTE COUNT: 0.3 10*9/L (ref 0.0–0.4)
EOSINOPHILS RELATIVE PERCENT: 4 %
LARGE UNSTAINED CELLS: 3 % (ref 0–4)
LYMPHOCYTES ABSOLUTE COUNT: 2.5 10*9/L (ref 1.5–5.0)
LYMPHOCYTES RELATIVE PERCENT: 38.8 %
MEAN CORPUSCULAR HEMOGLOBIN CONC: 31.8 g/dL (ref 31.0–37.0)
MEAN CORPUSCULAR HEMOGLOBIN: 29.6 pg (ref 26.0–34.0)
MEAN CORPUSCULAR VOLUME: 93.2 fL (ref 80.0–100.0)
MEAN PLATELET VOLUME: 8.9 fL (ref 7.0–10.0)
MONOCYTES ABSOLUTE COUNT: 0.5 10*9/L (ref 0.2–0.8)
MONOCYTES RELATIVE PERCENT: 7.7 %
NEUTROPHILS RELATIVE PERCENT: 45.8 %
PLATELET COUNT: 244 10*9/L (ref 150–440)
RED BLOOD CELL COUNT: 4.56 10*12/L (ref 4.50–5.90)
RED CELL DISTRIBUTION WIDTH: 14.2 % (ref 12.0–15.0)
WBC ADJUSTED: 6.3 10*9/L (ref 4.5–11.0)

## 2018-11-20 LAB — AST (SGOT): Aspartate aminotransferase:CCnc:Pt:Ser/Plas:Qn:: 29

## 2018-11-20 LAB — EGFR CKD-EPI AA MALE: Lab: >90

## 2018-11-20 LAB — CREATININE: EGFR CKD-EPI AA MALE: >90 mL/min/{1.73_m2} (ref >=60)

## 2018-11-20 LAB — ALT (SGPT): Alanine aminotransferase:CCnc:Pt:Ser/Plas:Qn:: 18

## 2018-11-20 LAB — LYMPHOCYTES ABSOLUTE COUNT: Lymphocytes:NCnc:Pt:Bld:Qn:Automated count: 2.5

## 2018-11-20 LAB — ALT: ALT (SGPT): 18 U/L (ref <50)

## 2018-11-20 NOTE — Unmapped (Signed)
1130??Patient in today for Remicade infusion. Patient has no s/s of infection. No issues from the last infusion.  1201??Remicade started ,patient instructed to use call bell /call nurse for any s/s of unsual symptoms during infusion. Patient educated on possible s/s of reaction,such as chestpain ,itching,shortness of breath lightheadedness and any kind of discomfort. Patient verbalized understanding.  1300??Infusion completed and well tolerated.  1310??Remicade 500 mg/250 NS infused over 2hrs per protocol . Pt alert and oriented, offered no complaints during infusion. IV flushed with 10ml NS post infusion. Pt tolerated infusion well.  Remicade 2hr protocol  Start of infusion x   ???????????????????????????????????????????????????? x till complete.

## 2018-11-21 ENCOUNTER — Ambulatory Visit: Payer: Medicare HMO | Admitting: Licensed Clinical Social Worker

## 2018-11-21 ENCOUNTER — Other Ambulatory Visit: Payer: Self-pay

## 2018-11-28 ENCOUNTER — Ambulatory Visit: Payer: Medicare HMO | Admitting: Licensed Clinical Social Worker

## 2018-11-28 ENCOUNTER — Other Ambulatory Visit: Payer: Self-pay

## 2018-11-28 ENCOUNTER — Ambulatory Visit (INDEPENDENT_AMBULATORY_CARE_PROVIDER_SITE_OTHER): Payer: Medicare HMO | Admitting: Psychiatry

## 2018-11-28 DIAGNOSIS — Z91199 Patient's noncompliance with other medical treatment and regimen due to unspecified reason: Secondary | ICD-10-CM | POA: Insufficient documentation

## 2018-11-28 DIAGNOSIS — Z5329 Procedure and treatment not carried out because of patient's decision for other reasons: Secondary | ICD-10-CM

## 2018-11-28 NOTE — Progress Notes (Signed)
No response to calls 

## 2019-01-05 NOTE — Progress Notes (Signed)
   THERAPIST PROGRESS NOTE  Session Time: 31  Participation Level: Active  Type of Therapy: Individual Therapy  Treatment Goals addressed: Coping  Interventions: CBT and Motivational Interviewing  Summary: Austin Preston is a 63 y.o. male who presents with continued symptoms of diagnosis.  Therapist assisted Patient with processing his behavior and emotions since last session.  Therapist utilized MI to assist Patient with working on his goals. Therapist assisted Patient with discussing weekend plans and coping skills used.  Therapist role played with Patient coping skills.   Suicidal/Homicidal: No  Plan: Return again in 2 weeks.  Diagnosis: Axis I: Schizoaffective Disorder    Axis II: No diagnosis    Lubertha South, LCSW

## 2019-01-13 MED ORDER — METHOTREXATE SODIUM 25 MG/ML INJECTION SOLUTION
INTRAMUSCULAR | 0 refills | 0.00000 days | Status: CP
Start: 2019-01-13 — End: 2020-06-23

## 2019-01-13 MED ORDER — METHOTREXATE SODIUM 25 MG/ML INJECTION SOLUTION: 25 mg | mL | 0 refills | 84 days | Status: AC

## 2019-01-13 NOTE — Unmapped (Signed)
[facility] Clinical Assessment & Refill Coordination Note    Roy Kirk, DOB: 1955-09-04  Phone: [phone] (home)     All above HIPAA information was verified with patient.     Specialty Medication(s):   Inflammatory Disorders: methotrexate (injectable)     Current Outpatient Medications   Medication Sig Dispense Refill   ??? empty container Misc Use as directed 1 each PRN   ??? folic acid (FOLVITE) 1 MG tablet Take 1 tablet (1 mg total) by mouth daily. 90 tablet 3   ??? INFLIXIMAB (REMICADE IV) Infuse into a venous catheter.     ??? LORazepam (ATIVAN) 1 MG tablet TAKE 1 TABLET (1 MG TOTAL) BY MOUTH EVERY 12 (TWELVE) HOURS AS NEEDED FOR ANXIETY FOR UP TO 10 DAYS     ??? methotrexate 25 mg/mL injection solution Inject 1mL (25mg ) under the skin every 7 days 12 mL 0   ??? syringe with needle 1 mL 27 x 1/2 Syrg Use 1 each every seven (7) days as directed. 50 each 0     No current facility-administered medications for this visit.         Changes to medications: Roy Kirk reports no changes at this time.    No Known Allergies    Changes to allergies: No    SPECIALTY MEDICATION ADHERENCE     methotrexate 25mg /ml: 14 days of medicine on hand     Medication Adherence    Patient reported X missed doses in the last month: 0  Specialty Medication: methotrexate 25mg /ml          Specialty medication(s) dose(s) confirmed: Regimen is correct and unchanged.     Are there any concerns with adherence? No    Adherence counseling provided? Not needed    CLINICAL MANAGEMENT AND INTERVENTION      Clinical Benefit Assessment:    Do you feel the medicine is effective or helping your condition? Yes    Clinical Benefit counseling provided? Not needed    Adverse Effects Assessment:    Are you experiencing any side effects? No    Are you experiencing difficulty administering your medicine? No    Quality of Life Assessment: How many days over the past month did your rheumatoid arthritis keep you from your normal activities? For example, brushing your teeth or getting up in the morning. 0    Have you discussed this with your provider? Not needed    Therapy Appropriateness:    Is therapy appropriate? Yes, therapy is appropriate and should be continued    DISEASE/MEDICATION-SPECIFIC INFORMATION      For patients on injectable medications: Patient currently has 2 doses left.  Next injection is scheduled for 01/15/2019.    PATIENT SPECIFIC NEEDS     ? Does the patient have any physical, cognitive, or cultural barriers? No    ? Is the patient high risk? No     ? Does the patient require a Care Management Plan? No     ? Does the patient require physician intervention or other additional services (i.e. nutrition, smoking cessation, social work)? No      SHIPPING     Specialty Medication(s) to be Shipped:   Inflammatory Disorders: methotrexate 25mg /ml    Other medication(s) to be shipped: folic acid, syringes     Changes to insurance: No    Delivery Scheduled: Yes, Expected medication delivery date: 01/22/2019.  However, Rx request for refills was sent to the provider as there are none remaining.  Medication will be delivered via Next Day Courier to the confirmed prescription address in [facility].    The patient will receive a drug information handout for each medication shipped and additional FDA Medication Guides as required.  Verified that patient has previously received a Conservation officer, historic buildings.    All of the patient's questions and concerns have been addressed.    Roy Kirk   [facility] Specialty Pharmacist

## 2019-01-15 ENCOUNTER — Ambulatory Visit: Admit: 2019-01-15 | Discharge: 2019-01-16 | Payer: MEDICARE

## 2019-01-15 DIAGNOSIS — R768 Other specified abnormal immunological findings in serum: Principal | ICD-10-CM

## 2019-01-15 DIAGNOSIS — M0579 Rheumatoid arthritis with rheumatoid factor of multiple sites without organ or systems involvement: Principal | ICD-10-CM

## 2019-01-15 LAB — BLOOD UREA NITROGEN: Urea nitrogen:MCnc:Pt:Ser/Plas:Qn:: 20

## 2019-01-15 LAB — CBC W/ AUTO DIFF
BASOPHILS ABSOLUTE COUNT: 0 10*9/L (ref 0.0–0.1)
EOSINOPHILS ABSOLUTE COUNT: 0.3 10*9/L (ref 0.0–0.4)
EOSINOPHILS RELATIVE PERCENT: 4.6 %
HEMATOCRIT: 43.8 % (ref 41.0–53.0)
HEMOGLOBIN: 14.1 g/dL (ref 13.5–17.5)
LARGE UNSTAINED CELLS: 4 % (ref 0–4)
LYMPHOCYTES ABSOLUTE COUNT: 2.2 10*9/L (ref 1.5–5.0)
LYMPHOCYTES RELATIVE PERCENT: 39.8 %
MEAN CORPUSCULAR HEMOGLOBIN CONC: 32.2 g/dL (ref 31.0–37.0)
MEAN CORPUSCULAR HEMOGLOBIN: 29.9 pg (ref 26.0–34.0)
MEAN CORPUSCULAR VOLUME: 92.8 fL (ref 80.0–100.0)
MEAN PLATELET VOLUME: 7.8 fL (ref 7.0–10.0)
MONOCYTES ABSOLUTE COUNT: 0.4 10*9/L (ref 0.2–0.8)
MONOCYTES RELATIVE PERCENT: 7.5 %
NEUTROPHILS ABSOLUTE COUNT: 2.4 10*9/L (ref 2.0–7.5)
NEUTROPHILS RELATIVE PERCENT: 43.6 %
PLATELET COUNT: 230 10*9/L (ref 150–440)
RED BLOOD CELL COUNT: 4.72 10*12/L (ref 4.50–5.90)
RED CELL DISTRIBUTION WIDTH: 14.3 % (ref 12.0–15.0)
WBC ADJUSTED: 5.6 10*9/L (ref 4.5–11.0)

## 2019-01-15 LAB — ALT (SGPT): Alanine aminotransferase:CCnc:Pt:Ser/Plas:Qn:: 18

## 2019-01-15 LAB — CREATININE
CREATININE: 1.32 mg/dL — ABNORMAL HIGH (ref 0.70–1.30)
EGFR CKD-EPI AA MALE: 67 mL/min/{1.73_m2} (ref >=60)

## 2019-01-15 LAB — HEMOGLOBIN: Hemoglobin:MCnc:Pt:Bld:Qn:: 14.1

## 2019-01-15 LAB — EGFR CKD-EPI AA MALE: Lab: 67

## 2019-01-15 LAB — AST (SGOT): Aspartate aminotransferase:CCnc:Pt:Ser/Plas:Qn:: 34

## 2019-01-15 NOTE — Unmapped (Signed)
1105 Pt in for infusion. Pt denies any S\s of infection. Pt call light with in reach. Pt teaching regarding S\s of adverse reaction and how to use call light to summon staff if experiencing any S\S during infusion. Pt verbalizing understanding.     1117 Remicade 500mg  in 258ml@100ml /hrx38min then to infuse as follows  @300ml /hr until the infusion is complete.      1141  IV infusing. Pt denies any complaints. Pt BP up. Pt stating  I take medicine and I took it today.  Pt encouraged to speak with his PCP. Pt stating  its always like that.  Pt encouraged to follow up with PCP. Pt teaching regarding S\s of adverse reaction and who to call if experiencing any S\s after dc from [facility]. Pt verbalizing understanding.   1218 Remicade infusion complete. Pt BP elevated. Pt teaching regarding going home and taking blood pressure meds. Pt verbalizing understanding.   1230 VSS no S\S of adverse reaction. Pt dcd home.

## 2019-01-21 MED FILL — BD TUBERCULIN SYRINGE 1 ML 27 X 1/2": 84 days supply | Qty: 12 | Fill #1

## 2019-01-21 MED FILL — FOLIC ACID 1 MG TABLET: ORAL | 90 days supply | Qty: 90 | Fill #1

## 2019-01-21 MED FILL — BD TUBERCULIN SYRINGE 1 ML 27 X 1/2": 84 days supply | Qty: 12 | Fill #1 | Status: AC

## 2019-01-21 MED FILL — FOLIC ACID 1 MG TABLET: 90 days supply | Qty: 90 | Fill #1 | Status: AC

## 2019-01-21 MED FILL — METHOTREXATE SODIUM 25 MG/ML INJECTION SOLUTION: 84 days supply | Qty: 12 | Fill #0 | Status: AC

## 2019-01-21 MED FILL — METHOTREXATE SODIUM 25 MG/ML INJECTION SOLUTION: SUBCUTANEOUS | 84 days supply | Qty: 12 | Fill #0

## 2019-01-30 ENCOUNTER — Telehealth: Payer: Self-pay | Admitting: Licensed Clinical Social Worker

## 2019-01-30 NOTE — Telephone Encounter (Signed)
LCSW called to follow up on voicemail from patient. Patient voiced needing his psychiatric medications especially due to the increased stress of being dx. with COVID. Discussed patient possibly having prescriptions at [facility] - according to chart; also recommended patient to follow up with Dr. Shea Evans - as he has seen her before, last visit in September 2020. Patient voiced agreement with following up with [facility] and Dr. Shea Evans and voiced appreciation.

## 2019-03-13 ENCOUNTER — Encounter: Admit: 2019-03-13 | Discharge: 2019-03-14 | Payer: MEDICARE

## 2019-03-13 DIAGNOSIS — M0579 Rheumatoid arthritis with rheumatoid factor of multiple sites without organ or systems involvement: Principal | ICD-10-CM

## 2019-03-13 DIAGNOSIS — R768 Other specified abnormal immunological findings in serum: Principal | ICD-10-CM

## 2019-03-13 LAB — CBC W/ AUTO DIFF
BASOPHILS ABSOLUTE COUNT: 0 10*9/L (ref 0.0–0.1)
EOSINOPHILS ABSOLUTE COUNT: 0.2 10*9/L (ref 0.0–0.4)
EOSINOPHILS RELATIVE PERCENT: 3.1 %
HEMATOCRIT: 42.3 % (ref 41.0–53.0)
HEMOGLOBIN: 13.4 g/dL — ABNORMAL LOW (ref 13.5–17.5)
LARGE UNSTAINED CELLS: 3 % (ref 0–4)
LYMPHOCYTES ABSOLUTE COUNT: 1.8 10*9/L (ref 1.5–5.0)
LYMPHOCYTES RELATIVE PERCENT: 34.2 %
MEAN CORPUSCULAR HEMOGLOBIN CONC: 31.8 g/dL (ref 31.0–37.0)
MEAN CORPUSCULAR HEMOGLOBIN: 30.1 pg (ref 26.0–34.0)
MEAN CORPUSCULAR VOLUME: 94.8 fL (ref 80.0–100.0)
MEAN PLATELET VOLUME: 9.3 fL (ref 7.0–10.0)
MONOCYTES ABSOLUTE COUNT: 0.4 10*9/L (ref 0.2–0.8)
MONOCYTES RELATIVE PERCENT: 7.2 %
NEUTROPHILS ABSOLUTE COUNT: 2.8 10*9/L (ref 2.0–7.5)
NEUTROPHILS RELATIVE PERCENT: 51.6 %
PLATELET COUNT: 233 10*9/L (ref 150–440)
RED BLOOD CELL COUNT: 4.46 10*12/L — ABNORMAL LOW (ref 4.50–5.90)
RED CELL DISTRIBUTION WIDTH: 14.7 % (ref 12.0–15.0)

## 2019-03-13 LAB — CREATININE: CREATININE: 1.14 mg/dL (ref 0.70–1.30)

## 2019-03-13 LAB — AST (SGOT): Aspartate aminotransferase:CCnc:Pt:Ser/Plas:Qn:: 35

## 2019-03-13 LAB — ALT (SGPT): Alanine aminotransferase:CCnc:Pt:Ser/Plas:Qn:: 22

## 2019-03-13 LAB — NEUTROPHILS RELATIVE PERCENT: Neutrophils/100 leukocytes:NFr:Pt:Bld:Qn:Automated count: 51.6

## 2019-03-13 LAB — BLOOD UREA NITROGEN: Urea nitrogen:MCnc:Pt:Ser/Plas:Qn:: 22 — ABNORMAL HIGH

## 2019-03-13 LAB — EGFR CKD-EPI NON-AA MALE
Glomerular filtration rate/1.73 sq M.predicted.non black:ArVRat:Pt:Ser/Plas/Bld:Qn:Creatinine-based formula (CKD-EPI): 69

## 2019-03-13 NOTE — Unmapped (Signed)
1115??Patient in today for Remicade infusion. Patient has no s/s of infection. No issues from the last infusion.  1153??Remicade started ,patient instructed to use call bell /call nurse for any s/s of unsual symptoms during infusion. Patient educated on possible s/s of reaction,such as chestpain ,itching,shortness of breath lightheadedness and any kind of discomfort. Patient verbalized understanding.  1252??Infusion completed and well tolerated.  1300??Remicade 500 mg/250 NS infused over 1hrs per protocol . Pt alert and oriented, offered no complaints during infusion. IV flushed with??77ml NS post infusion. Pt tolerated infusion well.  Remicade 1hr protocol  Start of infusion x   ???????????????????????????????????????????????????? x till complete.

## 2019-04-08 NOTE — Unmapped (Signed)
Reason for call: Pt is requesting for a call back in regards to home infuisions? Pt stated that he recv'd a document advising him that he is eligible for home infuisions and that once he called the number on document he was advised he isnt eligible? Pt stated that its urgent he speak with someone.       Last ov: Visit date not found  Next ov: 07/29/2019

## 2019-04-09 NOTE — Unmapped (Signed)
Spoke with patient. He states when he spoke with someone from [facility] (he was unable to name a person or location) and when he spoke with someone there they told the patient his doctor did not want him to have home infusion. Pt states he is very interested in home infusions because he lives so far away. Please advise.       Pt states he called: [phone]

## 2019-04-10 ENCOUNTER — Telehealth: Admit: 2019-04-10 | Discharge: 2019-04-11

## 2019-04-10 DIAGNOSIS — M059 Rheumatoid arthritis with rheumatoid factor, unspecified: Principal | ICD-10-CM

## 2019-04-10 DIAGNOSIS — Z79899 Other long term (current) drug therapy: Principal | ICD-10-CM

## 2019-04-10 NOTE — Unmapped (Signed)
I spent 7 minutes on the real-time audio and video visit with the patient on the date of service. I spent an additional 5 minutes on pre- and post-visit activities.     The patient was not located and I was not located within 250 yards of a hospital based location during the real-time audio and video visit. The patient was physically located in [place] or a state in which I am permitted to provide care. The patient and/or parent/guardian understood that s/he may incur co-pays and cost sharing, and agreed to the telemedicine visit. The visit was reasonable and appropriate under the circumstances given the patient's presentation at the time.    The patient and/or parent/guardian has been advised of the potential risks and limitations of this mode of treatment (including, but not limited to, the absence of in-person examination) and has agreed to be treated using telemedicine. The patient's/patient's family's questions regarding telemedicine have been answered.    If the visit was completed in an ambulatory setting, the patient and/or parent/guardian has also been advised to contact their provider???s office for worsening conditions, and seek emergency medical treatment and/or call 911 if the patient deems either necessary.          REASON FOR VISIT: home infusions    Identification: Pt self identified using name and date of birth  Patient location:   The limitations of this telemedicine encounter were discussed with patient. Both the patient and myself agreed to this encounter despite these limitations. Benefits of this telemedicine encounter included allowing for continued care of patient and minimizing risk of exposure to COVID-19.     HISTORY: Mr. Mangel is a 64 y.o. male with a hx of RA dx in 1992, serologies unknown. Has been treated with mtx 25 mg sq and remicade 6.5mg /kg q 8wks. 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 This is treated with topical therapies.   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   Additional hx of Hep C for which he has not been treated. He is followed by PCP for this.    Interim history:   Pt did not log on to video at scheduled time. Called at 1307, he was driving, reported he would be home in 15 min.  Pt logged on to video. He requested this appointment to discuss changing his infusions to home infusions. He received a letter from [employer] that he is eligible to receive his Remicade infusions at home.  He would prefer to do this as it is a long way for him to drive to the clinic.  He feels his arthritis has been stable.  He is a little more achy today with the cold rainy weather.  Some pain in the hip as well, though this is chronic for him.  Overall he feels that the Remicade infusions are working well for his arthritis.  He continues methotrexate weekly and endorses good compliance and tolerability.    CURRENT MEDICATIONS:  Current Outpatient Medications   Medication Sig Dispense Refill   ??? empty container Misc Use as directed 1 each PRN   ??? folic acid (FOLVITE) 1 MG tablet Take 1 tablet (1 mg total) by mouth daily. 90 tablet 3   ??? INFLIXIMAB (REMICADE IV) Infuse into a venous catheter.     ??? LORazepam (ATIVAN) 1 MG tablet TAKE 1 TABLET (1 MG TOTAL) BY MOUTH EVERY 12 (TWELVE) HOURS AS NEEDED FOR ANXIETY FOR UP TO 10 DAYS     ???  methotrexate 25 mg/mL injection solution Inject 1mL (25mg ) under the skin every 7 days 12 mL 0   ??? syringe with needle 1 mL 27 x 1/2 Syrg Use 1 each every seven (7) days as directed. 50 each 0     No current facility-administered medications for this visit.        Past Medical History:   Diagnosis Date   ??? ADHD    ??? Bipolar disorder (CMS-HCC)    ??? Hypertension    ??? Rheumatoid arthritis (CMS-HCC)    ??? Schizophrenia (CMS-HCC) 2006   ??? TB lung, latent         Record Review: Available records were reviewed, including pertinent office visits, labs, and imaging.      REVIEW OF SYSTEMS: Ten system were reviewed and negative except as noted above.    PHYSICAL EXAM:  Patient reported vitals:  There were no vitals filed for this visit.   General:   Does not sound to be in distress   Lungs:  No wheezing, coughing, or increased respiratory effort noted   Psych:  Appropriate interaction       ASSESSMENT/PLAN:  1. Seropositive rheumatoid arthritis (CMS-HCC)  Overall stable.  Patient inquiring about at home infusions.  Discussed that per clinic recommendations as well as ACR recommendations I do not recommend that he receives at home infusions due to elevated risk if he were to have a reaction.  He verbalizes understanding and agrees to continuing in clinic infusions.  We discussed the upcoming move to [facility], he has received paperwork indicating that his next infusion will be done there.    2. Methotrexate, long term, current use  He is up-to-date on monitoring labs which are done with his infusions.  Reviewed during visit, stable.      HCM:   - PCV13 Status: has declined in the past  - PPSV 23 Status: has declined in the past   - Annual Influenza vaccine. Status:  Has declined in the past  - Bone health: not on prednisone     Return appointment as scheduled with myself in June and in December with Dr. Sullivan Lone.

## 2019-04-10 NOTE — Unmapped (Signed)
Spoke with patient. Patient would like next available with Danne Harbor or Dr.Gilbert to get scheduled for sooner appt to discuss at home infusion. Pt was transferred to main number to get scheduled.

## 2019-04-11 MED ORDER — METHOTREXATE SODIUM 25 MG/ML INJECTION SOLUTION
SUBCUTANEOUS | 0 refills | 84 days | Status: CP
Start: 2019-04-11 — End: 2020-04-10

## 2019-04-11 NOTE — Unmapped (Signed)
[facility] Refill Coordination Note    Specialty Medication(s) to be Shipped:   Inflammatory Disorders: methotrexate (injectable)    Other medication(s) to be shipped: n/a     Roy Kirk, DOB: 08/31/1955  Phone: [phone] (home)       All above HIPAA information was verified with patient.     Was a Nurse, learning disability used for this call? No    Completed refill call assessment today to schedule patient's medication shipment from the [facility] [phone]).       Specialty medication(s) and dose(s) confirmed: Regimen is correct and unchanged.   Changes to medications: [name] reports no changes at this time.  Changes to insurance: No  Questions for the pharmacist: No    Confirmed patient received Welcome Packet with first shipment. The patient will receive a drug information handout for each medication shipped and additional FDA Medication Guides as required.       DISEASE/MEDICATION-SPECIFIC INFORMATION        For patients on injectable medications: Patient currently has 1 doses left.  Next injection is scheduled for 2/24.    SPECIALTY MEDICATION ADHERENCE     Medication Adherence    Patient reported X missed doses in the last month: 0  Specialty Medication: methotrexate  Patient is on additional specialty medications: No  Patient is on more than two specialty medications: No  Any gaps in refill history greater than 2 weeks in the last 3 months: no  Demonstrates understanding of importance of adherence: yes  Informant: patient                Methotrexate 25mg /ml: Patient has 7 days of medication on hand      SHIPPING     Shipping address confirmed in Epic.     Delivery Scheduled: Yes, Expected medication delivery date: 2/25.  However, Rx request for refills was sent to the provider as there are none remaining.     Medication will be delivered via Same Day Courier to the prescription address in Epic WAM.    Olga Millers   [facility] Specialty Technician

## 2019-04-11 NOTE — Unmapped (Signed)
Methotrexate refill  Last ov: 04/10/2019  Next ov: 07/29/2019     Labs:   AST   Date Value Ref Range Status   03/13/2019 35 17 - 47 U/L Final     ALT   Date Value Ref Range Status   03/13/2019 22 <50 U/L Final     Creatinine   Date Value Ref Range Status   03/13/2019 1.14 0.70 - 1.30 mg/dL Final     WBC   Date Value Ref Range Status   03/13/2019 5.4 4.5 - 11.0 10*9/L Final     HGB   Date Value Ref Range Status   03/13/2019 13.4 (L) 13.5 - 17.5 g/dL Final     HCT   Date Value Ref Range Status   03/13/2019 42.3 41.0 - 53.0 % Final     MCV   Date Value Ref Range Status   03/13/2019 94.8 80.0 - 100.0 fL Final     RDW   Date Value Ref Range Status   03/13/2019 14.7 12.0 - 15.0 % Final     Platelet   Date Value Ref Range Status   03/13/2019 233 150 - 440 10*9/L Final     Neutrophils %   Date Value Ref Range Status   03/13/2019 51.6 % Final     Lymphocytes %   Date Value Ref Range Status   03/13/2019 34.2 % Final     Monocytes %   Date Value Ref Range Status   03/13/2019 7.2 % Final     Eosinophils %   Date Value Ref Range Status   03/13/2019 3.1 % Final     Basophils %   Date Value Ref Range Status   03/13/2019 0.7 % Final

## 2019-04-17 MED FILL — METHOTREXATE SODIUM 25 MG/ML INJECTION SOLUTION: SUBCUTANEOUS | 84 days supply | Qty: 12 | Fill #0

## 2019-04-17 MED FILL — METHOTREXATE SODIUM 25 MG/ML INJECTION SOLUTION: 84 days supply | Qty: 12 | Fill #0 | Status: AC

## 2019-05-07 ENCOUNTER — Encounter: Admit: 2019-05-07 | Discharge: 2019-05-08 | Payer: MEDICARE

## 2019-05-07 LAB — CBC W/ AUTO DIFF
BASOPHILS ABSOLUTE COUNT: 0 10*9/L (ref 0.0–0.1)
BASOPHILS RELATIVE PERCENT: 0.6 %
EOSINOPHILS ABSOLUTE COUNT: 0.1 10*9/L (ref 0.0–0.7)
EOSINOPHILS RELATIVE PERCENT: 1.9 %
HEMATOCRIT: 40.7 % (ref 38.0–50.0)
HEMOGLOBIN: 13.7 g/dL (ref 13.5–17.5)
LYMPHOCYTES ABSOLUTE COUNT: 2.6 10*9/L (ref 0.7–4.0)
LYMPHOCYTES RELATIVE PERCENT: 37.1 %
MEAN CORPUSCULAR HEMOGLOBIN CONC: 33.5 g/dL (ref 30.0–36.0)
MEAN CORPUSCULAR VOLUME: 91.1 fL (ref 81.0–95.0)
MEAN PLATELET VOLUME: 8.5 fL (ref 7.0–10.0)
MONOCYTES ABSOLUTE COUNT: 0.7 10*9/L (ref 0.1–1.0)
NEUTROPHILS ABSOLUTE COUNT: 3.5 10*9/L (ref 1.7–7.7)
NEUTROPHILS RELATIVE PERCENT: 50 %
NUCLEATED RED BLOOD CELLS: 0 /100{WBCs} (ref <=4)
PLATELET COUNT: 225 10*9/L (ref 150–450)
RED BLOOD CELL COUNT: 4.47 10*12/L (ref 4.32–5.72)
RED CELL DISTRIBUTION WIDTH: 14.1 % (ref 12.0–15.0)
WBC ADJUSTED: 7.1 10*9/L (ref 3.5–10.5)

## 2019-05-07 LAB — AST (SGOT): Aspartate aminotransferase:CCnc:Pt:Ser/Plas:Qn:: 32

## 2019-05-07 LAB — EGFR CKD-EPI NON-AA MALE
Glomerular filtration rate/1.73 sq M.predicted.non black:ArVRat:Pt:Ser/Plas/Bld:Qn:Creatinine-based formula (CKD-EPI): 76

## 2019-05-07 LAB — BLOOD UREA NITROGEN: Urea nitrogen:MCnc:Pt:Ser/Plas:Qn:: 18

## 2019-05-07 LAB — ALT (SGPT): Alanine aminotransferase:CCnc:Pt:Ser/Plas:Qn:: 25

## 2019-05-07 LAB — AST: AST (SGOT): 32 U/L (ref <34)

## 2019-05-07 LAB — MONOCYTES RELATIVE PERCENT: Monocytes/100 leukocytes:NFr:Pt:Bld:Qn:Automated count: 10.4

## 2019-05-07 LAB — CREATININE: EGFR CKD-EPI AA MALE: 88 mL/min/{1.73_m2}

## 2019-05-07 NOTE — Unmapped (Signed)
Pt presents for accelerated Remicade.  Pt denies any recent infection, VSS.  IV 24 G placed on right forearm, labs drawn, premeds administered.  Pt aware of potential reaction/side effects, call bell within reach.    1114 Remicade 500 mg started at the following rates:    148ml/hr for 15 min  336ml/hr for rest of infusion.    1215  Infusion complete. Pt tolerated without complication, VSS.  IV flushed per policy and d/c'd, gauze and coban applied.  Pt left clinic in no acute distress.

## 2019-05-08 DIAGNOSIS — M0579 Rheumatoid arthritis with rheumatoid factor of multiple sites without organ or systems involvement: Principal | ICD-10-CM

## 2019-05-08 MED ORDER — METHOTREXATE SODIUM 25 MG/ML INJECTION SOLUTION
SUBCUTANEOUS | 0 refills | 84 days | Status: CP
Start: 2019-05-08 — End: 2020-05-07

## 2019-05-08 NOTE — Unmapped (Signed)
Methotrexate refill  Last ov:    04/10/2019          Next ov: 07/29/2019     Labs:   AST   Date Value Ref Range Status   05/07/2019 32 <34 U/L Final     ALT   Date Value Ref Range Status   05/07/2019 25 10 - 49 U/L Final     Creatinine   Date Value Ref Range Status   05/07/2019 1.05 0.60 - 1.10 mg/dL Final     WBC   Date Value Ref Range Status   05/07/2019 7.1 3.5 - 10.5 10*9/L Final     HGB   Date Value Ref Range Status   05/07/2019 13.7 13.5 - 17.5 g/dL Final     HCT   Date Value Ref Range Status   05/07/2019 40.7 38.0 - 50.0 % Final     MCV   Date Value Ref Range Status   05/07/2019 91.1 81.0 - 95.0 fL Final     RDW   Date Value Ref Range Status   05/07/2019 14.1 12.0 - 15.0 % Final     Platelet   Date Value Ref Range Status   05/07/2019 225 150 - 450 10*9/L Final     Neutrophils %   Date Value Ref Range Status   05/07/2019 50.0 % Final     Lymphocytes %   Date Value Ref Range Status   05/07/2019 37.1 % Final     Monocytes %   Date Value Ref Range Status   05/07/2019 10.4 % Final     Eosinophils %   Date Value Ref Range Status   05/07/2019 1.9 % Final     Basophils %   Date Value Ref Range Status   05/07/2019 0.6 % Final

## 2019-05-08 NOTE — Unmapped (Signed)
Roy Kirk 's methotrexate shipment will be canceled  as a result of the medication is too soon to refill until 06/13/19.     I have reached out to the patient and communicated the delivery change. We will not reschedule the medication due to patient not being due for medication and have removed this/these medication(s) from the work request.  We have canceled this work request.

## 2019-05-08 NOTE — Unmapped (Signed)
[facility] Refill Coordination Note    Specialty Medication(s) to be Shipped:   Inflammatory Disorders: methotrexate (injectable)    Other medication(s) to be shipped: syringes     [name], DOB: March 26, 1955  Phone: [phone] (home)       All above HIPAA information was verified with patient.     Was a Nurse, learning disability used for this call? No    Completed refill call assessment today to schedule patient's medication shipment from the [facility] [phone]).       Specialty medication(s) and dose(s) confirmed: Regimen is correct and unchanged.   Changes to medications: Dewel reports no changes at this time.  Changes to insurance: No  Questions for the pharmacist: No    Confirmed patient received Welcome Packet with first shipment. The patient will receive a drug information handout for each medication shipped and additional FDA Medication Guides as required.       DISEASE/MEDICATION-SPECIFIC INFORMATION        For patients on injectable medications: Patient currently has 1 doses left.  Next injection is scheduled for 3/24.    SPECIALTY MEDICATION ADHERENCE     Medication Adherence    Patient reported X missed doses in the last month: 0  Specialty Medication: methotrexate  Patient is on additional specialty medications: No  Patient is on more than two specialty medications: No  Any gaps in refill history greater than 2 weeks in the last 3 months: no  Demonstrates understanding of importance of adherence: yes  Informant: patient                Methotrexate 25mg /ml: Patient has 7 days of medication on hand      SHIPPING     Shipping address confirmed in Epic.     Delivery Scheduled: Yes, Expected medication delivery date: 3/25.  However, Rx request for refills was sent to the provider as there are none remaining.     Medication will be delivered via Same Day Courier to the prescription address in Epic WAM.    Olga Millers   [facility] Specialty Technician

## 2019-05-14 MED ORDER — AMLODIPINE 5 MG TABLET
ORAL | 0 days
Start: 2019-05-14 — End: ?

## 2019-05-14 MED ORDER — FOLIC ACID 1 MG TABLET
ORAL | 0.00000 days
Start: 2019-05-14 — End: 2020-06-23

## 2019-06-11 ENCOUNTER — Telehealth (INDEPENDENT_AMBULATORY_CARE_PROVIDER_SITE_OTHER): Payer: Medicare Other | Admitting: Psychiatry

## 2019-06-11 ENCOUNTER — Encounter: Payer: Self-pay | Admitting: Psychiatry

## 2019-06-11 ENCOUNTER — Other Ambulatory Visit: Payer: Self-pay

## 2019-06-11 DIAGNOSIS — F250 Schizoaffective disorder, bipolar type: Secondary | ICD-10-CM

## 2019-06-11 DIAGNOSIS — F1220 Cannabis dependence, uncomplicated: Secondary | ICD-10-CM

## 2019-06-11 DIAGNOSIS — Z79899 Other long term (current) drug therapy: Secondary | ICD-10-CM | POA: Diagnosis not present

## 2019-06-11 DIAGNOSIS — F4310 Post-traumatic stress disorder, unspecified: Secondary | ICD-10-CM | POA: Diagnosis not present

## 2019-06-11 DIAGNOSIS — Z9111 Patient's noncompliance with dietary regimen: Secondary | ICD-10-CM | POA: Insufficient documentation

## 2019-06-11 DIAGNOSIS — Z91199 Patient's noncompliance with other medical treatment and regimen due to unspecified reason: Secondary | ICD-10-CM | POA: Insufficient documentation

## 2019-06-11 MED ORDER — OLANZAPINE 2.5 MG PO TABS: 2.5000 mg | ORAL_TABLET | Freq: Every day | ORAL | 1 refills | Status: AC

## 2019-06-11 NOTE — Patient Instructions (Signed)
Follow-up in clinic on May 18 at 9 AM

## 2019-06-11 NOTE — Progress Notes (Signed)
Provider Location : [facility] Patient Location : Work  Virtual Visit via Video Note  I connected with Austin Preston on 06/11/19 at  9:30 AM EDT by a video enabled telemedicine application and verified that I am speaking with the correct person using two identifiers.   I discussed the limitations of evaluation and management by telemedicine and the availability of in person appointments. The patient expressed understanding and agreed to proceed.    I discussed the assessment and treatment plan with the patient. The patient was provided an opportunity to ask questions and all were answered. The patient agreed with the plan and demonstrated an understanding of the instructions.   The patient was advised to call back or seek an in-person evaluation if the symptoms worsen or if the condition fails to improve as anticipated.    BH MD OP Progress Note  06/11/2019 9:57 AM [name]  MRN:  [id number]  Chief Complaint:  Chief Complaint    Follow-up     HPI: Austin Preston is a 64 year old African-American male, widowed, has a history of schizoaffective disorder, PTSD, rheumatoid arthritis, cannabis use disorder severe, was evaluated by telemedicine today.  Patient was last seen for an initial consultation on 11/07/2018.  At that visit patient was started on medications as well as labs ordered and was advised to follow-up in 4 weeks.  Patient however no showed his appointment.  This is patient's second appointment with Clinical research associate.  Patient today reports he has been noncompliant with his olanzapine which was started in September.  Patient reports he however was restarted on hydroxyzine by his primary care provider.  He seems to believe it helps.  He also reports he is currently taking trazodone for sleep.  Patient continues to struggle with anxiety and sadness.  He reports his brother who was 3 years older than him passed away on Easter holiday few days ago.  Patient reports he hence had to go to [place] for  his brother's funeral.  Patient reports he feels sad about it however was happy to meet all his family members as well as meet his 61-month-old grandchild for the very first time.  Patient reports he currently does not have any suicidal thoughts.  He denies any homicidality.  He reports the last time he heard voices was a long time ago.  He currently denies any perceptual disturbances.  Patient however reports he is willing to restart the olanzapine for his mood lability.  Patient also agrees to get the labs done.  Patient continues to smoke cannabis.  He reports he smokes 2 joints per day and is not willing to quit since it is illegal in [place].  He thinks the cannabis actually helps him and not harm him.  Patient denies any other concerns today. Visit Diagnosis:    ICD-10-CM   1. Schizoaffective disorder, bipolar type (HCC)  F25.0 OLANZapine (ZYPREXA) 2.5 MG tablet  2. PTSD (post-traumatic stress disorder)  F43.10 OLANZapine (ZYPREXA) 2.5 MG tablet  3. Cannabis use disorder, severe, dependence (HCC)  F12.20   4. High risk medication use  Z79.899   5. Noncompliance with treatment plan  Z91.11     Past Psychiatric History: I have reviewed past psychiatric history from my progress note on 11/07/2018.  Past trials of Zoloft-noncompliant, lorazepam  Past Medical History:  Past Medical History:  Diagnosis Date  . ADHD (attention deficit hyperactivity disorder)   . Anxiety   . Depression   . Hypertension   . PTSD (post-traumatic stress disorder)   .  Schizophrenia [facility])     Past Surgical History:  Procedure Laterality Date  . NASAL DERMOID CYST EXCISION      Family Psychiatric History: I have reviewed family psychiatric history from my progress note on 11/07/2018  Family History:  Family History  Problem Relation Age of Onset  . Bipolar disorder Brother   . Bipolar disorder Brother     Social History: Reviewed social history from my progress note on 11/07/2018 Social History    Socioeconomic History  . Marital status: Widowed    Spouse name: Not on file  . Number of children: 5  . Years of education: Not on file  . Highest education level: Associate degree: occupational, Scientist, product/process development, or vocational program  Occupational History  . Not on file  Tobacco Use  . Smoking status: Never Smoker  . Smokeless tobacco: Never Used  Substance and Sexual Activity  . Alcohol use: Yes    Alcohol/week: 3.0 standard drinks    Types: 3 Cans of beer per week    Comment: 3 pers a week5  . Drug use: Yes    Frequency: 7.0 times per week    Types: Marijuana  . Sexual activity: Not Currently  Other Topics Concern  . Not on file  Social History Narrative  . Not on file   Social Determinants of Health   Financial Resource Strain: High Risk  . Difficulty of Paying Living Expenses: Hard  Food Insecurity: Food Insecurity Present  . Worried About Programme researcher, broadcasting/film/video in the Last Year: Often true  . Ran Out of Food in the Last Year: Often true  Transportation Needs: Unmet Transportation Needs  . Lack of Transportation (Medical): Yes  . Lack of Transportation (Non-Medical): Yes  Physical Activity: Inactive  . Days of Exercise per Week: 0 days  . Minutes of Exercise per Session: 0 min  Stress: Stress Concern Present  . Feeling of Stress : Very much  Social Connections: Unknown  . Frequency of Communication with Friends and Family: Not on file  . Frequency of Social Gatherings with Friends and Family: Not on file  . Attends Religious Services: 1 to 4 times per year  . Active Member of Clubs or Organizations: Yes  . Attends Banker Meetings: 1 to 4 times per year  . Marital Status: Widowed    Allergies: No Known Allergies  Metabolic Disorder Labs: No results found for: HGBA1C, MPG No results found for: PROLACTIN No results found for: CHOL, TRIG, HDL, CHOLHDL, VLDL, LDLCALC No results found for: TSH  Therapeutic Level Labs: No results found for:  LITHIUM No results found for: VALPROATE No components found for:  CBMZ  Current Medications: Current Outpatient Medications  Medication Sig Dispense Refill  . amLODipine (NORVASC) 5 MG tablet Take by mouth.    . methotrexate 250 MG/10ML injection Inject into the skin.    Marland Kitchen traZODone (DESYREL) 50 MG tablet Take by mouth.    . B-D TB SYRINGE 1CC/27GX1/2" 27G X 1/2" 1 ML MISC     . folic acid (FOLVITE) 1 MG tablet Take 1 mg by mouth daily.    . hydrOXYzine (VISTARIL) 25 MG capsule Take 1-2 capsules (25-50 mg total) by mouth daily as needed. For severe anxiety, agitation 60 capsule 1  . inFLIXimab (REMICADE) 100 MG injection Inject 100 mg into the vein.    . methotrexate (RHEUMATREX) 2.5 MG tablet Take 20 mg by mouth once a week.    . methotrexate 250 MG/10ML injection Inject into the  muscle.    . methotrexate 50 MG/2ML injection     . OLANZapine (ZYPREXA) 2.5 MG tablet Take 1 tablet (2.5 mg total) by mouth at bedtime. 30 tablet 1   No current facility-administered medications for this visit.     Musculoskeletal: Strength & Muscle Tone: UTA Gait & Station: normal Patient leans: N/A  Psychiatric Specialty Exam: Review of Systems  Psychiatric/Behavioral: Positive for dysphoric mood. The patient is nervous/anxious.   All other systems reviewed and are negative.   There were no vitals taken for this visit.There is no height or weight on file to calculate BMI.  General Appearance: Casual  Eye Contact:  Fair  Speech:  Normal Rate  Volume:  Normal  Mood:  Anxious and Dysphoric  Affect:  Congruent  Thought Process:  Goal Directed and Descriptions of Associations: Intact  Orientation:  Full (Time, Place, and Person)  Thought Content: Logical   Suicidal Thoughts:  No  Homicidal Thoughts:  No  Memory:  Immediate;   Fair Recent;   Fair Remote;   Fair  Judgement:  Fair  Insight:  Fair  Psychomotor Activity:  Normal  Concentration:  Concentration: Fair and Attention Span: Fair   Recall:  [employer] of Knowledge: Fair  Language: Fair  Akathisia:  No  Handed:  Right  AIMS (if indicated):UTA  Assets:  Communication Skills Desire for Improvement Housing Social Support  ADL's:  Intact  Cognition: WNL  Sleep:  Fair   Screenings:   Assessment and Plan: Austin Preston is a 64 year old African-American male, widowed, employed, lives in [place], has a history of schizophrenia, bipolar disorder, PTSD, ADHD, rheumatoid arthritis was evaluated by telemedicine today.  Patient is biologically predisposed given his history of trauma, health problems, substance abuse problems.  He also has psychosocial stressors of legal problems.  Patient with history of several deaths in the family, most recently his brother who passed away few days ago.  Patient continues to abuse cannabis and is not willing to quit.  He does have a history of noncompliance with medications and follow-up sessions.  Discussed plan as noted below.  Plan Schizoaffective disorder-unstable Restart Zyprexa 2.5 mg p.o. nightly  PTSD-unstable Patient was referred for CBT in the past.  He has been noncompliant. Hydroxyzine 25 to 50 mg p.o. daily as needed for anxiety attacks. Continue trazodone 50 mg p.o. nightly.  Cannabis use disorder severe-unstable Provided smoking cessation counseling, he is not willing to quit.  High risk medication use-we will reorder the following labs-TSH, lipid panel, hemoglobin A1c, prolactin.  He agrees to get it done this Friday a.m.  Noncompliance with medication and treatment plan-encouraged compliance.  Discussed with patient clinic policy.  Patient agrees to keep his follow-up appointments.  Follow-up in clinic in 3 to 4 weeks or sooner if needed.  I have spent atleast 30 minutes non face to face with patient today. More than 50 % of the time was spent for preparing to see the patient ( e.g., review of test, records ), obtaining and to review and separately obtained history ,  ordering medications and test ,psychoeducation and supportive psychotherapy and care coordination,as well as documenting clinical information in electronic health record,interpreting results of test and communication of results This note was generated in part or whole with voice recognition software. Voice recognition is usually quite accurate but there are transcription errors that can and very often do occur. I apologize for any typographical errors that were not detected and corrected.      Soo Steelman,  MD 06/11/2019, 9:57 AM

## 2019-06-24 ENCOUNTER — Telehealth: Payer: Self-pay

## 2019-06-24 NOTE — Telephone Encounter (Signed)
pam fax - and confirmed labwork orders on 06-13-19 3:25pm

## 2019-07-02 ENCOUNTER — Encounter: Admit: 2019-07-02 | Discharge: 2019-07-03 | Payer: MEDICARE

## 2019-07-02 LAB — CBC W/ AUTO DIFF
BASOPHILS ABSOLUTE COUNT: 0.1 10*9/L (ref 0.0–0.1)
BASOPHILS RELATIVE PERCENT: 0.8 %
EOSINOPHILS ABSOLUTE COUNT: 0.1 10*9/L (ref 0.0–0.7)
EOSINOPHILS RELATIVE PERCENT: 1.8 %
HEMOGLOBIN: 13.7 g/dL (ref 13.5–17.5)
LYMPHOCYTES ABSOLUTE COUNT: 2.3 10*9/L (ref 0.7–4.0)
LYMPHOCYTES RELATIVE PERCENT: 36.7 %
MEAN CORPUSCULAR HEMOGLOBIN CONC: 32.8 g/dL (ref 30.0–36.0)
MEAN CORPUSCULAR HEMOGLOBIN: 30.2 pg (ref 26.0–34.0)
MEAN CORPUSCULAR VOLUME: 92.2 fL (ref 81.0–95.0)
MEAN PLATELET VOLUME: 8.2 fL (ref 7.0–10.0)
MONOCYTES ABSOLUTE COUNT: 0.8 10*9/L (ref 0.1–1.0)
MONOCYTES RELATIVE PERCENT: 12 %
NEUTROPHILS ABSOLUTE COUNT: 3.1 10*9/L (ref 1.7–7.7)
NEUTROPHILS RELATIVE PERCENT: 48.7 %
NUCLEATED RED BLOOD CELLS: 0 /100{WBCs} (ref <=4)
PLATELET COUNT: 264 10*9/L (ref 150–450)
RED BLOOD CELL COUNT: 4.52 10*12/L (ref 4.32–5.72)
WBC ADJUSTED: 6.4 10*9/L (ref 3.5–10.5)

## 2019-07-02 LAB — LYMPHOCYTES ABSOLUTE COUNT: Lymphocytes:NCnc:Pt:Bld:Qn:Automated count: 2.3

## 2019-07-02 LAB — EGFR CKD-EPI AA MALE: Glomerular filtration rate/1.73 sq M.predicted.black:ArVRat:Pt:Ser/Plas/Bld:Qn:Creatinine-based formula (CKD-EPI): 83

## 2019-07-02 LAB — CREATININE: EGFR CKD-EPI NON-AA MALE: 72 mL/min/{1.73_m2}

## 2019-07-02 LAB — ALT (SGPT): Alanine aminotransferase:CCnc:Pt:Ser/Plas:Qn:: 17

## 2019-07-02 LAB — AST (SGOT): Aspartate aminotransferase:CCnc:Pt:Ser/Plas:Qn:: 24

## 2019-07-02 LAB — BLOOD UREA NITROGEN: Urea nitrogen:MCnc:Pt:Ser/Plas:Qn:: 16

## 2019-07-02 NOTE — Unmapped (Signed)
Pt presents for accelerated Remicade.  Pt denies any recent infection, VSS.  IV placed, labs drawn, premeds administered.  Pt aware of potential reaction/side effects, call bell within reach.    1135 Remicade 500 mg started at the following rates:  170ml/hr for 15 min  332ml/hr for rest of infusion.  1240 Infusion complete. Pt tolerated without complication, VSS.  IV flushed per policy and d/c'd, gauze and coban applied.  Pt left clinic in no acute distress.

## 2019-07-04 NOTE — Unmapped (Signed)
[facility] Clinical Assessment & Refill Coordination Note    Roy Kirk, DOB: 05-Oct-1955  Phone: [phone] (home)     All above HIPAA information was verified with patient.     Was a Nurse, learning disability used for this call? No    Specialty Medication(s):   Inflammatory Disorders: methotrexate (injectable)     Current Outpatient Medications   Medication Sig Dispense Refill   ??? empty container Misc Use as directed 1 each PRN   ??? folic acid (FOLVITE) 1 MG tablet Take 1 tablet (1 mg total) by mouth daily. 90 tablet 3   ??? INFLIXIMAB (REMICADE IV) Infuse into a venous catheter.     ??? LORazepam (ATIVAN) 1 MG tablet TAKE 1 TABLET (1 MG TOTAL) BY MOUTH EVERY 12 (TWELVE) HOURS AS NEEDED FOR ANXIETY FOR UP TO 10 DAYS     ??? methotrexate 25 mg/mL injection solution Inject 1mL (25mg ) under the skin every 7 days 12 mL 0   ??? syringe with needle 1 mL 27 x 1/2 Syrg Use 1 each every seven (7) days as directed. 50 each 0     No current facility-administered medications for this visit.        Changes to medications: [name] reports no changes at this time.    No Known Allergies    Changes to allergies: No    SPECIALTY MEDICATION ADHERENCE     methorexate 25mg /ml: 14 days of medicine on hand     Medication Adherence    Patient reported X missed doses in the last month: 0  Specialty Medication: methotrexate 25mg /ml          Specialty medication(s) dose(s) confirmed: Regimen is correct and unchanged.     Are there any concerns with adherence? No    Adherence counseling provided? Not needed    CLINICAL MANAGEMENT AND INTERVENTION      Clinical Benefit Assessment:    Do you feel the medicine is effective or helping your condition? Yes    Clinical Benefit counseling provided? Not needed    Adverse Effects Assessment:    Are you experiencing any side effects? No    Are you experiencing difficulty administering your medicine? No    Quality of Life Assessment:    Rheumatology:   Quality of Life    On a scale of 1 ??? 10 with 1 representing not at all and 10 representing completely ??? how has your rheumatologic condition affected your:  Daily pain level?: 3  Ability to complete your regular daily tasks (prepare meals, get dressed, etc.)?: 2  Ability to participate in social or family activities?: 1         Have you discussed this with your provider? Not needed    Therapy Appropriateness:    Is therapy appropriate? Yes, therapy is appropriate and should be continued    DISEASE/MEDICATION-SPECIFIC INFORMATION      For patients on injectable medications: Patient currently has 2 doses left.  Next injection is scheduled for 07/08/2019.    PATIENT SPECIFIC NEEDS     - Does the patient have any physical, cognitive, or cultural barriers? No    - Is the patient high risk? No     - Does the patient require a Care Management Plan? No     - Does the patient require physician intervention or other additional services (i.e. nutrition, smoking cessation, social work)? No      SHIPPING     Specialty Medication(s) to be Shipped:  Inflammatory Disorders: methotrexate 25mg /ml    Other medication(s) to be shipped: syringes, folic acid     Changes to insurance: No    Delivery Scheduled: Yes, Expected medication delivery date: 07/17/2019.     Medication will be delivered via Next Day Courier to the confirmed prescription address in [facility].    The patient will receive a drug information handout for each medication shipped and additional FDA Medication Guides as required.  Verified that patient has previously received a Conservation officer, historic buildings.    All of the patient's questions and concerns have been addressed.    Karene Fry Haden Cavenaugh   [facility] Specialty Pharmacist

## 2019-07-04 NOTE — Unmapped (Signed)
The [facility] has made a third and final attempt to reach this patient to refill the following medication: methotrexate 25mg /ml.      We have left voicemails on the following phone numbers: [phone]; [phone] and have been unable to leave messages on the following phone numbers: [phone].    Dates contacted: 06/25/2019; 06/30/2019; 07/04/2019  Last scheduled delivery: 04/11/2019 (84 days supply)    The patient may be at risk of non-compliance with this medication. The patient should call the [facility] at [phone] (option 4) to refill medication.    Karene Fry Julicia Krieger   [facility] Specialty Pharmacist

## 2019-07-08 ENCOUNTER — Other Ambulatory Visit: Payer: Self-pay

## 2019-07-08 ENCOUNTER — Telehealth (INDEPENDENT_AMBULATORY_CARE_PROVIDER_SITE_OTHER): Payer: Medicare Other | Admitting: Psychiatry

## 2019-07-08 DIAGNOSIS — F250 Schizoaffective disorder, bipolar type: Secondary | ICD-10-CM

## 2019-07-08 NOTE — Progress Notes (Addendum)
Attempted to contact patient by video appointment link, sent text message, left voicemail on his phone. However per Lea - patient had cancelled this appointment .

## 2019-07-08 NOTE — Addendum Note (Signed)
Addended byJomarie Longs on: 07/08/2019 09:26 AM   Modules accepted: Level of Service

## 2019-07-16 MED FILL — METHOTREXATE SODIUM 25 MG/ML INJECTION SOLUTION: SUBCUTANEOUS | 84 days supply | Qty: 12 | Fill #0

## 2019-07-16 MED FILL — METHOTREXATE SODIUM 25 MG/ML INJECTION SOLUTION: 84 days supply | Qty: 12 | Fill #0 | Status: AC

## 2019-07-16 MED FILL — FOLIC ACID 1 MG TABLET: ORAL | 90 days supply | Qty: 90 | Fill #2

## 2019-07-16 MED FILL — FOLIC ACID 1 MG TABLET: 90 days supply | Qty: 90 | Fill #2 | Status: AC

## 2019-07-16 MED FILL — BD TUBERCULIN SYRINGE 1 ML 27 X 1/2": 84 days supply | Qty: 12 | Fill #2 | Status: AC

## 2019-07-16 MED FILL — BD TUBERCULIN SYRINGE 1 ML 27 X 1/2": 84 days supply | Qty: 12 | Fill #2

## 2019-07-29 NOTE — Unmapped (Unsigned)
{    Coding tips - Do not edit this text, it will delete upon signing of note!    ?? Telephone visits [phone] for Physicians and APP??s and [phone] for Non- Physician Clinicians)- Only use minutes on the phone to determine level of service.    ?? Video visits [phone]) - Use both minutes on video and pre/post minutes to determine level of service.       :75688}    I spent *** minutes on the {phone audio video visit:67489} visit with the patient on the date of service. I spent an additional *** minutes on pre- and post-visit activities on the date of service.     The patient {Otsego Attestations Was/Was Not:71380} located and I {Selah Attestations Was/Was Not:71380} located within 250 yards of a hospital based location during the {phone audio video visit:67489} visit. The patient was physically located in [place] or a state in which I am permitted to provide care. The patient and/or parent/guardian understood that s/he may incur co-pays and cost sharing, and agreed to the telemedicine visit. The visit was reasonable and appropriate under the circumstances given the patient's presentation at the time.    The patient and/or parent/guardian has been advised of the potential risks and limitations of this mode of treatment (including, but not limited to, the absence of in-person examination) and has agreed to be treated using telemedicine. The patient's/patient's family's questions regarding telemedicine have been answered.    If the visit was completed in an ambulatory setting, the patient and/or parent/guardian has also been advised to contact their provider???s office for worsening conditions, and seek emergency medical treatment and/or call 911 if the patient deems either necessary.         REASON FOR VISIT: f/u RA     Identification: Pt self identified using name and date of birth  Patient location: [place]  The limitations of this telemedicine encounter were discussed with patient. Both the patient and myself agreed to this encounter despite these limitations. Benefits of this telemedicine encounter included allowing for continued care of patient and minimizing risk of exposure to COVID-19.     HISTORY: Roy Kirk is a 64 y.o. male with a hx of RA dx in 1992, serologies unknown. Has been treated with mtx 25 mg sq and remicade 6.5mg /kg q 8wks. 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 This is treated with topical therapies and improved when reducing infusions from every 4 wks to every 8 wks.   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   Additional hx of Hep C for which he has not been treated. He is followed by PCP for this.    Interim history:   Did not log on to video at time of visit.   902: called home and cell numbers, no answer, LM at both.   909: called home and cell numbers, no answer, LM at both.   917: called home and cell numbers, no answer, LM at both to call and reschedule appt.     CURRENT MEDICATIONS:  Current Outpatient Medications   Medication Sig Dispense Refill   ??? empty container Misc Use as directed 1 each PRN   ??? folic acid (FOLVITE) 1 MG tablet Take 1 tablet (1 mg total) by mouth daily. 90 tablet 3   ??? INFLIXIMAB (REMICADE IV) Infuse into a venous catheter.     ??? LORazepam (ATIVAN) 1 MG tablet TAKE 1 TABLET (1 MG TOTAL) BY MOUTH  EVERY 12 (TWELVE) HOURS AS NEEDED FOR ANXIETY FOR UP TO 10 DAYS     ??? methotrexate 25 mg/mL injection solution Inject 1mL (25mg ) under the skin every 7 days 12 mL 0   ??? syringe with needle 1 mL 27 x 1/2 Syrg Use 1 each every seven (7) days as directed. 50 each 0     No current facility-administered medications for this visit.       Past Medical History:   Diagnosis Date   ??? ADHD    ??? Bipolar disorder (CMS-HCC)    ??? Hypertension    ??? Rheumatoid arthritis (CMS-HCC)    ??? Schizophrenia (CMS-HCC) 2006   ??? TB lung, latent         Record Review: Available records were reviewed, including pertinent office visits, labs, and imaging. REVIEW OF SYSTEMS: Ten system were reviewed and negative except as noted above.    PHYSICAL EXAM:  Patient reported vitals:  There were no vitals filed for this visit.   General:   Does not sound to be in distress   Lungs:  No wheezing, coughing, or increased respiratory effort noted   Psych:  Appropriate interaction       ASSESSMENT/PLAN:        HCM:   - PCV13 Status: has declined in the past  - PPSV 23 Status: has declined in the past   - Annual Influenza vaccine. Status:  Has declined in the past  - Bone health: not on prednisone     Return appointment as scheduled in 6 mo with Dr Sullivan Lone

## 2019-08-06 ENCOUNTER — Ambulatory Visit: Admit: 2019-08-06 | Payer: MEDICARE

## 2019-08-06 NOTE — Unmapped (Signed)
Patient did not show for their rheumatology appointment today.

## 2019-08-19 NOTE — Unmapped (Signed)
[facility] RHEUMATOLOGY CLINIC - PHARMACIST Quarry manager team received denial for Remicade infusion on 05/07/19 with reason that PA was not obtained.  Anticipate denial for 03/13/19 as well as 07/11/19.  PA was not needed in previous years.  New PA now in place for future infusions.      Called to inform patient so he will not be surprised with denials.  Pharmacy Revenue Integrity team will appeal the denials.     [name]

## 2019-08-27 ENCOUNTER — Encounter: Admit: 2019-08-27 | Discharge: 2019-08-28 | Payer: MEDICARE

## 2019-08-27 LAB — CBC W/ AUTO DIFF
BASOPHILS ABSOLUTE COUNT: 0.1 10*9/L (ref 0.0–0.1)
BASOPHILS RELATIVE PERCENT: 1.6 %
EOSINOPHILS ABSOLUTE COUNT: 0.2 10*9/L (ref 0.0–0.7)
EOSINOPHILS RELATIVE PERCENT: 2.7 %
HEMATOCRIT: 42.3 % (ref 38.0–50.0)
HEMOGLOBIN: 13.7 g/dL (ref 13.5–17.5)
LYMPHOCYTES ABSOLUTE COUNT: 2.4 10*9/L (ref 0.7–4.0)
LYMPHOCYTES RELATIVE PERCENT: 38.2 %
MEAN CORPUSCULAR HEMOGLOBIN CONC: 32.5 g/dL (ref 30.0–36.0)
MEAN CORPUSCULAR HEMOGLOBIN: 30 pg (ref 26.0–34.0)
MEAN CORPUSCULAR VOLUME: 92.3 fL (ref 81.0–95.0)
MEAN PLATELET VOLUME: 8.2 fL (ref 7.0–10.0)
MONOCYTES ABSOLUTE COUNT: 0.7 10*9/L (ref 0.1–1.0)
PLATELET COUNT: 250 10*9/L (ref 150–450)
RED BLOOD CELL COUNT: 4.58 10*12/L (ref 4.32–5.72)
RED CELL DISTRIBUTION WIDTH: 14.4 % (ref 12.0–15.0)
WBC ADJUSTED: 6.3 10*9/L (ref 3.5–10.5)

## 2019-08-27 LAB — CREATININE
Creatinine:MCnc:Pt:Ser/Plas:Qn:: 0.98
EGFR CKD-EPI AA MALE: >90 mL/min/{1.73_m2}
EGFR CKD-EPI NON-AA MALE: 83 mL/min/{1.73_m2}

## 2019-08-27 LAB — BLOOD UREA NITROGEN: Urea nitrogen:MCnc:Pt:Ser/Plas:Qn:: 16

## 2019-08-27 LAB — AST (SGOT): Aspartate aminotransferase:CCnc:Pt:Ser/Plas:Qn:: 22

## 2019-08-27 LAB — EOSINOPHILS ABSOLUTE COUNT: Eosinophils:NCnc:Pt:Bld:Qn:Automated count: 0.2

## 2019-08-27 LAB — ALT (SGPT): Alanine aminotransferase:CCnc:Pt:Ser/Plas:Qn:: 17

## 2019-08-27 MED ADMIN — inFLIXimab (REMICADE) 6.5 mg/kg = 500 mg in sodium chloride (NS) 250 mL IVPB: 6.5 mg/kg | INTRAVENOUS | @ 16:00:00 | Stop: 2019-08-27

## 2019-08-27 NOTE — Unmapped (Signed)
Pt present here for Acc Remicade. Denies any recent fever/chills. VSS. IV obtained. Ptv has had medication in the past without any issues. Call bell within reach.    1140 Remicade 500mg /250 ml x 60 min initiated at this time.  123mlx15 min  311mlx45 min  1239 Infusion completed. VSS. IV flushed per protocol and d/c'd. Pt left clinic in stable condition

## 2019-08-29 LAB — HEPATITIS C RNA, QUANTITATIVE, PCR

## 2019-08-29 LAB — HCV RNA COMMENT: Lab: 0

## 2019-09-28 DIAGNOSIS — R768 Other specified abnormal immunological findings in serum: Principal | ICD-10-CM

## 2019-09-28 DIAGNOSIS — M0579 Rheumatoid arthritis with rheumatoid factor of multiple sites without organ or systems involvement: Principal | ICD-10-CM

## 2019-10-22 ENCOUNTER — Ambulatory Visit: Admit: 2019-10-22 | Discharge: 2019-10-23 | Payer: MEDICARE

## 2019-10-22 DIAGNOSIS — M0579 Rheumatoid arthritis with rheumatoid factor of multiple sites without organ or systems involvement: Principal | ICD-10-CM

## 2019-10-22 DIAGNOSIS — R768 Other specified abnormal immunological findings in serum: Principal | ICD-10-CM

## 2019-10-22 LAB — ALT (SGPT): Alanine aminotransferase:CCnc:Pt:Ser/Plas:Qn:: 12

## 2019-10-22 LAB — CBC W/ AUTO DIFF
BASOPHILS ABSOLUTE COUNT: 0 10*9/L (ref 0.0–0.1)
BASOPHILS RELATIVE PERCENT: 0.6 %
EOSINOPHILS ABSOLUTE COUNT: 0.2 10*9/L (ref 0.0–0.7)
EOSINOPHILS RELATIVE PERCENT: 3 %
HEMATOCRIT: 40.7 % (ref 38.0–50.0)
HEMOGLOBIN: 13.4 g/dL — ABNORMAL LOW (ref 13.5–17.5)
LYMPHOCYTES ABSOLUTE COUNT: 1.6 10*9/L (ref 0.7–4.0)
LYMPHOCYTES RELATIVE PERCENT: 28 %
MEAN CORPUSCULAR HEMOGLOBIN CONC: 33 g/dL (ref 30.0–36.0)
MEAN CORPUSCULAR HEMOGLOBIN: 30.1 pg (ref 26.0–34.0)
MEAN PLATELET VOLUME: 8.6 fL (ref 7.0–10.0)
MONOCYTES ABSOLUTE COUNT: 0.7 10*9/L (ref 0.1–1.0)
NEUTROPHILS ABSOLUTE COUNT: 3.3 10*9/L (ref 1.7–7.7)
NEUTROPHILS RELATIVE PERCENT: 57 %
NUCLEATED RED BLOOD CELLS: 0 /100{WBCs} (ref <=4)
PLATELET COUNT: 265 10*9/L (ref 150–450)
RED BLOOD CELL COUNT: 4.45 10*12/L (ref 4.32–5.72)
RED CELL DISTRIBUTION WIDTH: 14.1 % (ref 12.0–15.0)
WBC ADJUSTED: 5.8 10*9/L (ref 3.5–10.5)

## 2019-10-22 LAB — CREATININE: EGFR CKD-EPI AA MALE: >90 mL/min/{1.73_m2} (ref >=60)

## 2019-10-22 LAB — BLOOD UREA NITROGEN: Urea nitrogen:MCnc:Pt:Ser/Plas:Qn:: 12

## 2019-10-22 LAB — EGFR CKD-EPI NON-AA MALE
Glomerular filtration rate/1.73 sq M.predicted.non black:ArVRat:Pt:Ser/Plas/Bld:Qn:Creatinine-based formula (CKD-EPI): 84

## 2019-10-22 LAB — AST (SGOT): Aspartate aminotransferase:CCnc:Pt:Ser/Plas:Qn:: 21

## 2019-10-22 LAB — NEUTROPHILS RELATIVE PERCENT: Neutrophils/100 leukocytes:NFr:Pt:Bld:Qn:Automated count: 57

## 2019-10-22 MED ADMIN — inFLIXimab (REMICADE) 6.5 mg/kg = 500 mg in sodium chloride (NS) 250 mL IVPB: 6.5 mg/kg | INTRAVENOUS | @ 16:00:00 | Stop: 2019-10-22

## 2019-10-22 NOTE — Unmapped (Signed)
Pt presents for Accelerated Remicade.  Pt denies any recent infection or fever, VSS.  IV placed in RAC, pt tolerated well, labs drawn, no premeds ordered.  Pt aware of potential reaction/side effects, call bell within reach.    1139 Remicade 500 mg in 250 ml started at the following rates:    173ml/hr for 15 min  312ml/hr for rest of infusion.    1242 Infusion complete. Pt tolerated without complication, VSS.  IV flushed per policy and d/c'd, gauze and coban applied.  Pt left clinic in no acute distress.

## 2019-10-24 LAB — HEPATITIS C RNA, QUANTITATIVE, PCR

## 2019-10-24 LAB — HCV RNA(IU): Hepatitis C virus RNA:ACnc:Pt:Ser/Plas:Qn:Probe.amp.tar: 0

## 2019-12-03 NOTE — Unmapped (Signed)
[facility] RHEUMATOLOGY CLINIC - PHARMACIST NOTES    Called to discuss infliximab biosimilars. Patient was not available; LVM.    Blenda Nicely at [facility] rheumatology

## 2019-12-17 ENCOUNTER — Encounter: Admit: 2019-12-17 | Discharge: 2019-12-18 | Payer: MEDICARE

## 2019-12-17 DIAGNOSIS — M0579 Rheumatoid arthritis with rheumatoid factor of multiple sites without organ or systems involvement: Principal | ICD-10-CM

## 2019-12-17 DIAGNOSIS — R768 Other specified abnormal immunological findings in serum: Principal | ICD-10-CM

## 2019-12-17 LAB — CBC W/ AUTO DIFF
BASOPHILS RELATIVE PERCENT: 1.1 %
EOSINOPHILS ABSOLUTE COUNT: 0.2 10*9/L (ref 0.0–0.7)
EOSINOPHILS RELATIVE PERCENT: 3.2 %
HEMATOCRIT: 43.4 % (ref 38.0–50.0)
HEMOGLOBIN: 14.1 g/dL (ref 13.5–17.5)
LYMPHOCYTES ABSOLUTE COUNT: 2 10*9/L (ref 0.7–4.0)
LYMPHOCYTES RELATIVE PERCENT: 29.5 %
MEAN CORPUSCULAR HEMOGLOBIN CONC: 32.5 g/dL (ref 30.0–36.0)
MEAN CORPUSCULAR VOLUME: 91.3 fL (ref 81.0–95.0)
MEAN PLATELET VOLUME: 8.5 fL (ref 7.0–10.0)
MONOCYTES ABSOLUTE COUNT: 0.8 10*9/L (ref 0.1–1.0)
MONOCYTES RELATIVE PERCENT: 12.1 %
NEUTROPHILS ABSOLUTE COUNT: 3.7 10*9/L (ref 1.7–7.7)
PLATELET COUNT: 263 10*9/L (ref 150–450)
RED BLOOD CELL COUNT: 4.75 10*12/L (ref 4.32–5.72)
RED CELL DISTRIBUTION WIDTH: 14 % (ref 12.0–15.0)
WBC ADJUSTED: 6.8 10*9/L (ref 3.5–10.5)

## 2019-12-17 LAB — CREATININE: EGFR CKD-EPI AA MALE: 64 mL/min/{1.73_m2} (ref >=60)

## 2019-12-17 LAB — BLOOD UREA NITROGEN: Urea nitrogen:MCnc:Pt:Ser/Plas:Qn:: 17

## 2019-12-17 LAB — AST (SGOT): Aspartate aminotransferase:CCnc:Pt:Ser/Plas:Qn:: 23

## 2019-12-17 LAB — ALT (SGPT): Alanine aminotransferase:CCnc:Pt:Ser/Plas:Qn:: 14

## 2019-12-17 LAB — EGFR CKD-EPI AA MALE: Glomerular filtration rate/1.73 sq M.predicted.black:ArVRat:Pt:Ser/Plas/Bld:Qn:Creatinine-based formula (CKD-EPI): 64

## 2019-12-17 LAB — EOSINOPHILS RELATIVE PERCENT: Eosinophils/100 leukocytes:NFr:Pt:Bld:Qn:Automated count: 3.2

## 2019-12-17 MED ADMIN — inFLIXimab (REMICADE) 6.5 mg/kg = 500 mg in sodium chloride (NS) 250 mL IVPB: 6.5 mg/kg | INTRAVENOUS | @ 16:00:00 | Stop: 2019-12-17

## 2019-12-17 NOTE — Unmapped (Signed)
Pt presents for accelerated Remicade.  Pt denies any recent infection, VSS.  IV placed, labs drawn, premeds administered.  Pt aware of potential reaction/side effects, call bell within reach.    1210 Remicade 500 mg started at the following rates:  187ml/hr for 15 min  363ml/hr for rest of infusion.  1314 Infusion complete. Pt tolerated without complication, VSS.  IV flushed per policy and d/c'd, gauze and coban applied.  Pt left clinic in no acute distress.

## 2020-01-15 DIAGNOSIS — R768 Other specified abnormal immunological findings in serum: Principal | ICD-10-CM

## 2020-01-15 DIAGNOSIS — M0579 Rheumatoid arthritis with rheumatoid factor of multiple sites without organ or systems involvement: Principal | ICD-10-CM

## 2020-01-22 NOTE — Unmapped (Unsigned)
[facility] Special[facility] Pharmacy Refill Coordination Note  Medication: Methotrexate    Unable to reach patient to schedule shipment for medication being filled at [facility]. Left voicemail on phone.  As this is the 3rd unsuccessful attempt to reach the patient, no additional phone call attempts will be made at this time.      Phone numbers attempted: [phone] and [phone]  Last scheduled delivery: 07/16/19    Please call the [facility] at [phone] (option 4) should you have any further questions.      Thanks,  [facility] Specialty Team

## 2020-01-29 NOTE — Unmapped (Deleted)
Last clinic visit: 04/10/2019 Telemedicine appt    Accompanied by: *** who contributed key portions of the history.    Chief complaint: RA follow-up    History of Present Illness:     HPI:  Roy Kirk is a 64 y.o. male with a history of RA dx in 1992, serologies unknown who presents for follow up. . 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 This is treated with topical therapies. 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 Additional hx of Hep C for which he has not been treated. He is followed by PCP for this.      Events since last appt:   - Recent labs from 12/17/2019 were normal     Current Treatment:  - ***    Interval History:  - ***      Record review:  I have reviewed the patient's allergies, medications, pertinent past medical, surgical, social and family history and have updated in Epic where appropriate. I have also reviewed the pertinent past medical records including notes, labs, imaging tests in the medical record and in Care Everywhere.     Objective    Physical Exam:  There were no vitals filed for this visit.  There is no height or weight on file to calculate BMI.  GENERAL: The patient is well appearing, in no acute distress. Ambulates around exam room easily and climbs on exam table without difficulty.  SKIN: No rash.   EYES: EOMI, PERRL. Sclera anicteric, conjunctiva non- injected.   ENT: mucus membranes moist.   Neck: supple, no cervical lymphadenopathy  Respiratory: Breathing non-labored, CTA bilaterally  CV: Heart rate regular, no murmurs  GI: Abdomen soft, nontender, nondistended, no hepatosplenomegaly  VASCULAR: warm and well perfused extremities, no c/c/e.  NEURO: CN 2-12 grossly intact.   PSYCH: No depression or anxiety. Cooperative. Alert and oriented.   MUSCULOSKELETAL:   ?? Neck with painless, unlimited ROM.??  ?? Bilateral shoulders, elbows, wrists, hands, fingers:  No deformity, erythema, warmth, swelling, effusion, tenderness, or limited ROM.?? Able to curl all fingers. Prayer sign negative.   ?? Spine nontender to palpation.   ?? Hips without limited ROM.??  ?? Bilateral knees, ankles, feet, toes: No deformity, erythema, warmth, swelling, effusion, tenderness, or limited ROM. MTP squeeze negative.    Test Results:  No visits with results within 4 Week(s) from this visit.   Latest known visit with results is:   Infusion on 12/17/2019   Component Date Value Ref Range Status   ??? BUN 12/17/2019 17  9 - 23 mg/dL Final   ??? Creatinine 12/17/2019 1.36* 0.70 - 1.30 mg/dL Final   ??? EGFR CKD-EPI Non-African American,* 12/17/2019 56* >=60 mL/min/1.59m2 Final   ??? EGFR CKD-EPI African American, Male 12/17/2019 64  >=60 mL/min/1.42m2 Final   ??? AST 12/17/2019 23  <=34 U/L Final   ??? ALT 12/17/2019 14  10 - 49 U/L Final   ??? WBC 12/17/2019 6.8  3.5 - 10.5 10*9/L Final   ??? RBC 12/17/2019 4.75  4.32 - 5.72 10*12/L Final   ??? HGB 12/17/2019 14.1  13.5 - 17.5 g/dL Final   ??? HCT 16/11/9602 43.4  38.0 - 50.0 % Final   ??? MCV 12/17/2019 91.3  81.0 - 95.0 fL Final   ??? MCH 12/17/2019 29.7  26.0 - 34.0 pg Final   ??? MCHC 12/17/2019 32.5  30.0 - 36.0 g/dL Final   ??? RDW 54/10/8117 14.0  12.0 - 15.0 % Final   ???  MPV 12/17/2019 8.5  7.0 - 10.0 fL Final   ??? Platelet 12/17/2019 263  150 - 450 10*9/L Final   ??? Neutrophils % 12/17/2019 54.1  % Final   ??? Lymphocytes % 12/17/2019 29.5  % Final   ??? Monocytes % 12/17/2019 12.1  % Final   ??? Eosinophils % 12/17/2019 3.2  % Final   ??? Basophils % 12/17/2019 1.1  % Final   ??? Absolute Neutrophils 12/17/2019 3.7  1.7 - 7.7 10*9/L Final   ??? Absolute Lymphocytes 12/17/2019 2.0  0.7 - 4.0 10*9/L Final   ??? Absolute Monocytes 12/17/2019 0.8  0.1 - 1.0 10*9/L Final   ??? Absolute Eosinophils 12/17/2019 0.2  0.0 - 0.7 10*9/L Final   ??? Absolute Basophils 12/17/2019 0.1  0.0 - 0.1 10*9/L Final           Assessment/Plan:     Roy Kirk is a 65 y.o. male RA dx in 1992, serologies unknown. Has previously been treated with mtx 25 mg sq and remicade 6.5mg /kg q 8wks. 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 This is treated with topical therapies.       1. Seropositive rheumatoid arthritis (CMS-HCC)  Overall stable.      2. Methotrexate, long term, current use  He is up-to-date on monitoring labs which are done with his infusions.  Reviewed during visit, stable.        Immunization Counseling:  {abbyimmunization:64723}    We discussed the above including diagnosis and recommendations, agreed on the above plan, and all questions were answered.    Follow-up: ***    No follow-ups on file.    Danella Maiers, MD, MSCI  Assistant Professor of Medicine  Department of Medicine/Division of Rheumatology  [place] [facility]  [phone] clinic phone  [phone] clinic secure fax      We appreciate the opportunity to participate in the care of this patient. I personally spent *** minutes face-to-face and non-face-to-face in the care of this patient, which includes all pre, intra, and post visit time on the date of service.       There are no diagnoses linked to this encounter.

## 2020-01-29 NOTE — Unmapped (Signed)
Attempted to call pt as he had not arrived in clinic for rheumatology follow-up appt.  Tried calling home phone [phone], number not in service.  Then called mobile [phone]), left voice message that calling as had not arrived for appt and wanted to offer video appt.  Then called third number (listed as home, [phone]), left message that I was calling to offer video appt as had not arrived for in-person appt and would try other number again.  A few mins later I called both numbers again and left message that I had been unable to reach him today and asked him to call to reschedule appt.      Danella Maiers, MD, MSCI  January 29, 2020   10:04 AM

## 2020-02-02 DIAGNOSIS — R768 Other specified abnormal immunological findings in serum: Principal | ICD-10-CM

## 2020-02-02 DIAGNOSIS — M0579 Rheumatoid arthritis with rheumatoid factor of multiple sites without organ or systems involvement: Principal | ICD-10-CM

## 2020-03-16 DIAGNOSIS — M0579 Rheumatoid arthritis with rheumatoid factor of multiple sites without organ or systems involvement: Principal | ICD-10-CM

## 2020-03-16 DIAGNOSIS — R768 Other specified abnormal immunological findings in serum: Principal | ICD-10-CM

## 2020-03-17 DIAGNOSIS — R768 Other specified abnormal immunological findings in serum: Principal | ICD-10-CM

## 2020-03-17 DIAGNOSIS — M0579 Rheumatoid arthritis with rheumatoid factor of multiple sites without organ or systems involvement: Principal | ICD-10-CM

## 2020-03-19 ENCOUNTER — Ambulatory Visit: Admit: 2020-03-19 | Discharge: 2020-03-20 | Payer: MEDICARE

## 2020-03-19 ENCOUNTER — Encounter: Admit: 2020-03-19 | Discharge: 2020-03-20 | Payer: MEDICARE

## 2020-03-19 DIAGNOSIS — R768 Other specified abnormal immunological findings in serum: Principal | ICD-10-CM

## 2020-03-19 DIAGNOSIS — M0579 Rheumatoid arthritis with rheumatoid factor of multiple sites without organ or systems involvement: Principal | ICD-10-CM

## 2020-03-19 LAB — CBC W/ AUTO DIFF
BASOPHILS ABSOLUTE COUNT: 0.1 10*9/L (ref 0.0–0.1)
BASOPHILS RELATIVE PERCENT: 1.2 %
EOSINOPHILS ABSOLUTE COUNT: 0.1 10*9/L (ref 0.0–0.7)
EOSINOPHILS RELATIVE PERCENT: 2.1 %
HEMATOCRIT: 44 % (ref 38.0–50.0)
HEMOGLOBIN: 14.4 g/dL (ref 13.5–17.5)
LYMPHOCYTES ABSOLUTE COUNT: 1.8 10*9/L (ref 0.7–4.0)
LYMPHOCYTES RELATIVE PERCENT: 30.2 %
MEAN CORPUSCULAR HEMOGLOBIN CONC: 32.8 g/dL (ref 30.0–36.0)
MEAN CORPUSCULAR HEMOGLOBIN: 29.7 pg (ref 26.0–34.0)
MEAN CORPUSCULAR VOLUME: 90.4 fL (ref 81.0–95.0)
MEAN PLATELET VOLUME: 7.9 fL (ref 7.0–10.0)
MONOCYTES ABSOLUTE COUNT: 0.7 10*9/L (ref 0.1–1.0)
MONOCYTES RELATIVE PERCENT: 12.5 %
NEUTROPHILS ABSOLUTE COUNT: 3.2 10*9/L (ref 1.7–7.7)
NEUTROPHILS RELATIVE PERCENT: 54 %
NUCLEATED RED BLOOD CELLS: 0 /100{WBCs} (ref <=4)
PLATELET COUNT: 314 10*9/L (ref 150–450)
RED BLOOD CELL COUNT: 4.87 10*12/L (ref 4.32–5.72)
RED CELL DISTRIBUTION WIDTH: 14.5 % (ref 12.0–15.0)
WBC ADJUSTED: 5.9 10*9/L (ref 3.5–10.5)

## 2020-03-19 LAB — AST: AST (SGOT): 25 U/L (ref <=34)

## 2020-03-19 LAB — BUN: BLOOD UREA NITROGEN: 16 mg/dL (ref 9–23)

## 2020-03-19 LAB — CREATININE
CREATININE: 1.14 mg/dL — ABNORMAL HIGH
EGFR CKD-EPI AA MALE: 79 mL/min/{1.73_m2} (ref >=60)
EGFR CKD-EPI NON-AA MALE: 69 mL/min/{1.73_m2} (ref >=60)

## 2020-03-19 LAB — ALT: ALT (SGPT): 15 U/L (ref 10–49)

## 2020-03-19 MED ADMIN — infliximab-axxq (AVSOLA) 6.5 mg/kg = 500 mg in sodium chloride (NS) 250 mL rapid infusion: 6.5 mg/kg | INTRAVENOUS | @ 17:00:00 | Stop: 2020-03-19

## 2020-03-19 NOTE — Unmapped (Signed)
Patient did not show for their rheumatology appointment today.

## 2020-03-19 NOTE — Unmapped (Signed)
1116 Patient presents for accelerated Avsola (infliximab-axxq) infusion.  In no acute distress.  Vitals stable.  Reports no new medical issues or S/S of infection.  IV started.  See MAR for premeds.    1141 Avsola (infliximab-axxq) 500 mg started to infuse as follows:     100 ml/hr x 15 min then 300 ml/hr for remainder of infusion.    1242 Avsola (infliximab-axxq) infusion complete.  PIV flushed with NS.  Vitals stable.  Patient without any s/s of adverse reaction.    1245 IV d/c'd.  Patient discharged from Infusion Center.

## 2020-03-23 DIAGNOSIS — M0579 Rheumatoid arthritis with rheumatoid factor of multiple sites without organ or systems involvement: Principal | ICD-10-CM

## 2020-03-23 DIAGNOSIS — R768 Other specified abnormal immunological findings in serum: Principal | ICD-10-CM

## 2020-03-23 NOTE — Unmapped (Signed)
This patient has been disenrolled from the [facility] specialty pharmacy services due to multiple unsuccessful outreach attempts by the pharmacy.    Karene Fry Josepha Barbier  [facility] Shared [employer] Specialty Pharmacist

## 2020-05-09 DIAGNOSIS — R768 Other specified abnormal immunological findings in serum: Principal | ICD-10-CM

## 2020-05-09 DIAGNOSIS — M0579 Rheumatoid arthritis with rheumatoid factor of multiple sites without organ or systems involvement: Principal | ICD-10-CM

## 2020-05-19 ENCOUNTER — Encounter: Admit: 2020-05-19 | Discharge: 2020-05-20 | Payer: MEDICARE

## 2020-06-23 ENCOUNTER — Encounter: Admit: 2020-06-23 | Discharge: 2020-06-24 | Payer: MEDICARE

## 2020-06-23 DIAGNOSIS — M545 Chronic bilateral low back pain without sciatica: Principal | ICD-10-CM

## 2020-06-23 DIAGNOSIS — G8929 Other chronic pain: Principal | ICD-10-CM

## 2020-06-23 DIAGNOSIS — Z9119 Patient's noncompliance with other medical treatment and regimen: Principal | ICD-10-CM

## 2020-06-23 DIAGNOSIS — M059 Rheumatoid arthritis with rheumatoid factor, unspecified: Principal | ICD-10-CM

## 2020-07-09 DIAGNOSIS — M0579 Rheumatoid arthritis with rheumatoid factor of multiple sites without organ or systems involvement: Principal | ICD-10-CM

## 2020-07-09 DIAGNOSIS — R768 Other specified abnormal immunological findings in serum: Principal | ICD-10-CM

## 2020-07-14 ENCOUNTER — Encounter: Admit: 2020-07-14 | Discharge: 2020-07-15 | Payer: MEDICARE

## 2020-09-02 DIAGNOSIS — R768 Other specified abnormal immunological findings in serum: Principal | ICD-10-CM

## 2020-09-02 DIAGNOSIS — M0579 Rheumatoid arthritis with rheumatoid factor of multiple sites without organ or systems involvement: Principal | ICD-10-CM

## 2020-09-08 ENCOUNTER — Encounter: Admit: 2020-09-08 | Discharge: 2020-09-09 | Payer: MEDICARE

## 2020-10-21 DIAGNOSIS — R768 Other specified abnormal immunological findings in serum: Principal | ICD-10-CM

## 2020-10-21 DIAGNOSIS — M0579 Rheumatoid arthritis with rheumatoid factor of multiple sites without organ or systems involvement: Principal | ICD-10-CM

## 2020-12-24 DIAGNOSIS — R768 Other specified abnormal immunological findings in serum: Principal | ICD-10-CM

## 2020-12-24 DIAGNOSIS — M0579 Rheumatoid arthritis with rheumatoid factor of multiple sites without organ or systems involvement: Principal | ICD-10-CM

## 2021-02-20 DIAGNOSIS — M0579 Rheumatoid arthritis with rheumatoid factor of multiple sites without organ or systems involvement: Principal | ICD-10-CM

## 2021-02-20 DIAGNOSIS — R768 Other specified abnormal immunological findings in serum: Principal | ICD-10-CM
